# Patient Record
Sex: Female | Born: 1980 | Race: White | Hispanic: No | Marital: Married | Smoking: Never smoker
Health system: Southern US, Community
[De-identification: ages and names within clinical notes are randomized; demographics above are authoritative.]

## PROBLEM LIST (undated history)

## (undated) DIAGNOSIS — N3090 Cystitis, unspecified without hematuria: Secondary | ICD-10-CM

## (undated) DIAGNOSIS — K8020 Calculus of gallbladder without cholecystitis without obstruction: Secondary | ICD-10-CM

## (undated) DIAGNOSIS — I2699 Other pulmonary embolism without acute cor pulmonale: Secondary | ICD-10-CM

## (undated) DIAGNOSIS — N200 Calculus of kidney: Secondary | ICD-10-CM

## (undated) DIAGNOSIS — G43909 Migraine, unspecified, not intractable, without status migrainosus: Secondary | ICD-10-CM

## (undated) HISTORY — PX: TONSILLECTOMY: SUR1361

## (undated) HISTORY — PX: AUGMENTATION MAMMAPLASTY: SUR837

---

## 2004-01-25 ENCOUNTER — Emergency Department: Payer: Self-pay | Admitting: Unknown Physician Specialty

## 2004-01-30 ENCOUNTER — Emergency Department: Payer: Self-pay | Admitting: General Practice

## 2005-02-17 ENCOUNTER — Emergency Department: Payer: Self-pay | Admitting: Emergency Medicine

## 2005-09-29 ENCOUNTER — Emergency Department: Payer: Self-pay | Admitting: General Practice

## 2006-07-27 ENCOUNTER — Observation Stay: Payer: Self-pay | Admitting: Obstetrics & Gynecology

## 2006-11-07 ENCOUNTER — Observation Stay: Payer: Self-pay | Admitting: Obstetrics and Gynecology

## 2006-11-15 ENCOUNTER — Inpatient Hospital Stay: Payer: Self-pay

## 2007-04-24 ENCOUNTER — Emergency Department: Payer: Self-pay | Admitting: Emergency Medicine

## 2007-06-21 ENCOUNTER — Ambulatory Visit: Payer: Self-pay | Admitting: Urology

## 2007-06-28 ENCOUNTER — Ambulatory Visit: Payer: Self-pay | Admitting: Urology

## 2007-07-07 ENCOUNTER — Ambulatory Visit: Payer: Self-pay | Admitting: Family Medicine

## 2007-07-12 ENCOUNTER — Ambulatory Visit: Payer: Self-pay | Admitting: Urology

## 2007-07-16 ENCOUNTER — Ambulatory Visit: Payer: Self-pay

## 2007-07-24 ENCOUNTER — Emergency Department: Payer: Self-pay | Admitting: Emergency Medicine

## 2007-09-15 ENCOUNTER — Ambulatory Visit: Payer: Self-pay | Admitting: Unknown Physician Specialty

## 2007-09-16 ENCOUNTER — Ambulatory Visit: Payer: Self-pay | Admitting: Unknown Physician Specialty

## 2007-10-05 ENCOUNTER — Encounter
Admission: RE | Admit: 2007-10-05 | Discharge: 2007-10-05 | Payer: Self-pay | Admitting: Physical Medicine & Rehabilitation

## 2008-06-09 ENCOUNTER — Observation Stay: Payer: Self-pay | Admitting: Unknown Physician Specialty

## 2008-06-15 ENCOUNTER — Inpatient Hospital Stay: Payer: Self-pay

## 2009-08-10 ENCOUNTER — Emergency Department: Payer: Self-pay | Admitting: Emergency Medicine

## 2009-10-09 ENCOUNTER — Emergency Department: Payer: Self-pay | Admitting: Emergency Medicine

## 2009-12-21 ENCOUNTER — Emergency Department: Payer: Self-pay | Admitting: Emergency Medicine

## 2010-04-08 ENCOUNTER — Ambulatory Visit: Payer: Self-pay | Admitting: Internal Medicine

## 2010-09-09 ENCOUNTER — Ambulatory Visit: Payer: Self-pay

## 2010-10-28 ENCOUNTER — Ambulatory Visit: Payer: Self-pay

## 2010-12-10 ENCOUNTER — Ambulatory Visit: Payer: Self-pay | Admitting: Internal Medicine

## 2011-01-02 ENCOUNTER — Ambulatory Visit: Payer: Self-pay

## 2011-01-02 ENCOUNTER — Inpatient Hospital Stay: Payer: Self-pay | Admitting: Internal Medicine

## 2011-01-13 ENCOUNTER — Ambulatory Visit: Payer: Self-pay | Admitting: Internal Medicine

## 2011-01-21 ENCOUNTER — Ambulatory Visit: Payer: Self-pay | Admitting: Internal Medicine

## 2011-01-25 LAB — PROTIME-INR: Prothrombin Time: 33.9 secs — ABNORMAL HIGH (ref 11.5–14.7)

## 2011-01-27 LAB — PROTIME-INR
INR: 1.8
Prothrombin Time: 45.3 secs — ABNORMAL HIGH (ref 11.5–14.7)
Prothrombin Time: 21.3 secs — ABNORMAL HIGH (ref 11.5–14.7)

## 2011-02-03 LAB — PROTIME-INR
INR: 1.7
Prothrombin Time: 20 secs — ABNORMAL HIGH (ref 11.5–14.7)

## 2011-02-06 LAB — PROTIME-INR: Prothrombin Time: 20.7 secs — ABNORMAL HIGH (ref 11.5–14.7)

## 2011-02-10 LAB — HCG, QUANTITATIVE, PREGNANCY: Beta Hcg, Quant.: 944 m[IU]/mL — ABNORMAL HIGH

## 2011-02-10 LAB — PROTIME-INR: INR: 1.9

## 2011-02-12 ENCOUNTER — Ambulatory Visit: Payer: Self-pay | Admitting: Internal Medicine

## 2011-02-17 ENCOUNTER — Encounter: Payer: Self-pay | Admitting: Obstetrics and Gynecology

## 2011-02-17 LAB — CBC WITH DIFFERENTIAL/PLATELET
Basophil #: 0 10*3/uL (ref 0.0–0.1)
Eosinophil %: 1.6 %
HCT: 38.6 % (ref 35.0–47.0)
HGB: 13.2 g/dL (ref 12.0–16.0)
Lymphocyte #: 1.9 10*3/uL (ref 1.0–3.6)
MCHC: 34 g/dL (ref 32.0–36.0)
MCV: 95 fL (ref 80–100)
Monocyte %: 5.1 %
Platelet: 211 10*3/uL (ref 150–440)
RBC: 4.07 10*6/uL (ref 3.80–5.20)
WBC: 9.6 10*3/uL (ref 3.6–11.0)

## 2011-02-20 LAB — COMPREHENSIVE METABOLIC PANEL
Albumin: 3.8 g/dL (ref 3.4–5.0)
Anion Gap: 12 (ref 7–16)
BUN: 11 mg/dL (ref 7–18)
Bilirubin,Total: 0.5 mg/dL (ref 0.2–1.0)
Chloride: 104 mmol/L (ref 98–107)
Creatinine: 0.76 mg/dL (ref 0.60–1.30)
EGFR (African American): >60
EGFR (Non-African Amer.): >60
Glucose: 103 mg/dL — ABNORMAL HIGH (ref 65–99)
Osmolality: 279 (ref 275–301)
Potassium: 3.9 mmol/L (ref 3.5–5.1)
SGPT (ALT): 71 U/L
Sodium: 140 mmol/L (ref 136–145)
Total Protein: 7.2 g/dL (ref 6.4–8.2)

## 2011-02-20 LAB — HCG, QUANTITATIVE, PREGNANCY: Beta Hcg, Quant.: 20433 m[IU]/mL — ABNORMAL HIGH

## 2011-02-20 LAB — CBC
HCT: 36 % (ref 35.0–47.0)
MCH: 32.1 pg (ref 26.0–34.0)
MCHC: 33.5 g/dL (ref 32.0–36.0)
RDW: 13.8 % (ref 11.5–14.5)

## 2011-02-20 LAB — PROTIME-INR
INR: 1
Prothrombin Time: 13.3 secs (ref 11.5–14.7)

## 2011-02-20 LAB — CK TOTAL AND CKMB (NOT AT ARMC): CK-MB: <0.5 ng/mL — ABNORMAL LOW (ref 0.5–3.6)

## 2011-02-21 ENCOUNTER — Ambulatory Visit: Payer: Self-pay | Admitting: Internal Medicine

## 2011-02-21 ENCOUNTER — Inpatient Hospital Stay: Payer: Self-pay | Admitting: Internal Medicine

## 2011-02-22 LAB — CBC WITH DIFFERENTIAL/PLATELET
Basophil #: 0.1 10*3/uL (ref 0.0–0.1)
Eosinophil #: 0.1 10*3/uL (ref 0.0–0.7)
Eosinophil %: 1.6 %
Lymphocyte #: 2.4 10*3/uL (ref 1.0–3.6)
Lymphocyte %: 31.6 %
MCHC: 33.7 g/dL (ref 32.0–36.0)
MCV: 95 fL (ref 80–100)
Monocyte %: 6.5 %
Platelet: 171 10*3/uL (ref 150–440)
RDW: 13.9 % (ref 11.5–14.5)
WBC: 7.4 10*3/uL (ref 3.6–11.0)

## 2011-02-22 LAB — APTT: Activated PTT: 68.3 secs — ABNORMAL HIGH (ref 23.6–35.9)

## 2011-02-22 LAB — BASIC METABOLIC PANEL
Anion Gap: 11 (ref 7–16)
Chloride: 105 mmol/L (ref 98–107)
Co2: 24 mmol/L (ref 21–32)
Creatinine: 0.61 mg/dL (ref 0.60–1.30)
EGFR (Non-African Amer.): >60
Osmolality: 279 (ref 275–301)
Potassium: 3.9 mmol/L (ref 3.5–5.1)

## 2011-02-23 LAB — APTT: Activated PTT: 82.2 secs — ABNORMAL HIGH (ref 23.6–35.9)

## 2011-02-24 ENCOUNTER — Encounter: Payer: Self-pay | Admitting: Maternal and Fetal Medicine

## 2011-03-13 ENCOUNTER — Ambulatory Visit: Payer: Self-pay | Admitting: Internal Medicine

## 2011-03-21 ENCOUNTER — Ambulatory Visit: Payer: Self-pay | Admitting: Internal Medicine

## 2014-01-21 ENCOUNTER — Encounter (HOSPITAL_COMMUNITY): Payer: Self-pay | Admitting: Emergency Medicine

## 2014-01-21 DIAGNOSIS — Z8742 Personal history of other diseases of the female genital tract: Secondary | ICD-10-CM | POA: Insufficient documentation

## 2014-01-21 DIAGNOSIS — Z8669 Personal history of other diseases of the nervous system and sense organs: Secondary | ICD-10-CM | POA: Insufficient documentation

## 2014-01-21 DIAGNOSIS — M549 Dorsalgia, unspecified: Secondary | ICD-10-CM | POA: Insufficient documentation

## 2014-01-21 DIAGNOSIS — Z8719 Personal history of other diseases of the digestive system: Secondary | ICD-10-CM | POA: Insufficient documentation

## 2014-01-21 DIAGNOSIS — Z3202 Encounter for pregnancy test, result negative: Secondary | ICD-10-CM | POA: Insufficient documentation

## 2014-01-21 DIAGNOSIS — R197 Diarrhea, unspecified: Secondary | ICD-10-CM | POA: Insufficient documentation

## 2014-01-21 DIAGNOSIS — Z87442 Personal history of urinary calculi: Secondary | ICD-10-CM | POA: Insufficient documentation

## 2014-01-21 DIAGNOSIS — R1011 Right upper quadrant pain: Secondary | ICD-10-CM | POA: Insufficient documentation

## 2014-01-21 LAB — CBC WITH DIFFERENTIAL/PLATELET
Basophils Absolute: 0 10*3/uL (ref 0.0–0.1)
Basophils Relative: 0 % (ref 0–1)
Eosinophils Absolute: 0.2 10*3/uL (ref 0.0–0.7)
Eosinophils Relative: 3 % (ref 0–5)
HCT: 39 % (ref 36.0–46.0)
HEMOGLOBIN: 13.5 g/dL (ref 12.0–15.0)
LYMPHS PCT: 37 % (ref 12–46)
Lymphs Abs: 2.5 10*3/uL (ref 0.7–4.0)
MCH: 32.7 pg (ref 26.0–34.0)
MCHC: 34.6 g/dL (ref 30.0–36.0)
MCV: 94.4 fL (ref 78.0–100.0)
MONOS PCT: 7 % (ref 3–12)
Monocytes Absolute: 0.4 10*3/uL (ref 0.1–1.0)
NEUTROS ABS: 3.6 10*3/uL (ref 1.7–7.7)
Neutrophils Relative %: 53 % (ref 43–77)
Platelets: 250 10*3/uL (ref 150–400)
RBC: 4.13 MIL/uL (ref 3.87–5.11)
RDW: 12.8 % (ref 11.5–15.5)
WBC: 6.8 10*3/uL (ref 4.0–10.5)

## 2014-01-21 LAB — I-STAT TROPONIN, ED: Troponin i, poc: 0 ng/mL (ref 0.00–0.08)

## 2014-01-21 MED ORDER — ONDANSETRON HCL 4 MG/2ML IJ SOLN
4.0000 mg | Freq: Once | INTRAMUSCULAR | Status: AC
  Administered 2014-01-22: 4 mg via INTRAVENOUS
  Filled 2014-01-21: qty 2

## 2014-01-21 NOTE — ED Notes (Addendum)
Pt. reports right abdominal pain with nausea and diarrhea , pain radiating to lower back and mid chest , pt. stated pain similar to her gallstones in the past .

## 2014-01-22 ENCOUNTER — Emergency Department (HOSPITAL_COMMUNITY)
Admission: EM | Admit: 2014-01-22 | Discharge: 2014-01-22 | Disposition: A | Discharge status: Home / Self Care | Payer: Self-pay | Attending: Emergency Medicine | Admitting: Emergency Medicine

## 2014-01-22 ENCOUNTER — Emergency Department (HOSPITAL_COMMUNITY): Payer: Self-pay

## 2014-01-22 ENCOUNTER — Encounter (HOSPITAL_COMMUNITY): Payer: Self-pay | Admitting: Emergency Medicine

## 2014-01-22 DIAGNOSIS — M7918 Myalgia, other site: Secondary | ICD-10-CM

## 2014-01-22 DIAGNOSIS — R52 Pain, unspecified: Secondary | ICD-10-CM

## 2014-01-22 HISTORY — DX: Calculus of kidney: N20.0

## 2014-01-22 HISTORY — DX: Cystitis, unspecified without hematuria: N30.90

## 2014-01-22 HISTORY — DX: Calculus of gallbladder without cholecystitis without obstruction: K80.20

## 2014-01-22 HISTORY — DX: Migraine, unspecified, not intractable, without status migrainosus: G43.909

## 2014-01-22 LAB — POC URINE PREG, ED: Preg Test, Ur: NEGATIVE

## 2014-01-22 LAB — URINALYSIS, ROUTINE W REFLEX MICROSCOPIC
BILIRUBIN URINE: NEGATIVE
Glucose, UA: NEGATIVE mg/dL
Ketones, ur: NEGATIVE mg/dL
LEUKOCYTES UA: NEGATIVE
Nitrite: NEGATIVE
Protein, ur: NEGATIVE mg/dL
SPECIFIC GRAVITY, URINE: 1.029 (ref 1.005–1.030)
Urobilinogen, UA: 0.2 mg/dL (ref 0.0–1.0)
pH: 6 (ref 5.0–8.0)

## 2014-01-22 LAB — COMPREHENSIVE METABOLIC PANEL
ALT: 28 U/L (ref 0–35)
AST: 26 U/L (ref 0–37)
Albumin: 4 g/dL (ref 3.5–5.2)
Alkaline Phosphatase: 70 U/L (ref 39–117)
Anion gap: 8 (ref 5–15)
BILIRUBIN TOTAL: 0.8 mg/dL (ref 0.3–1.2)
BUN: 14 mg/dL (ref 6–23)
CALCIUM: 9.1 mg/dL (ref 8.4–10.5)
CO2: 25 mmol/L (ref 19–32)
Chloride: 105 mEq/L (ref 96–112)
Creatinine, Ser: 0.64 mg/dL (ref 0.50–1.10)
GFR calc Af Amer: >90 mL/min (ref >90)
GFR calc non Af Amer: >90 mL/min (ref >90)
GLUCOSE: 122 mg/dL — AB (ref 70–99)
POTASSIUM: 3.6 mmol/L (ref 3.5–5.1)
Sodium: 138 mmol/L (ref 135–145)
Total Protein: 6.5 g/dL (ref 6.0–8.3)

## 2014-01-22 LAB — URINE MICROSCOPIC-ADD ON

## 2014-01-22 LAB — PREGNANCY, URINE: PREG TEST UR: NEGATIVE

## 2014-01-22 LAB — LIPASE, BLOOD: Lipase: 28 U/L (ref 11–59)

## 2014-01-22 MED ORDER — NAPROXEN 375 MG PO TABS: 375.0000 mg | ORAL_TABLET | Freq: Two times a day (BID) | ORAL | Status: DC

## 2014-01-22 MED ORDER — METHOCARBAMOL 500 MG PO TABS
1000.0000 mg | ORAL_TABLET | Freq: Once | ORAL | Status: AC
  Administered 2014-01-22: 1000 mg via ORAL
  Filled 2014-01-22: qty 2

## 2014-01-22 MED ORDER — IOHEXOL 350 MG/ML SOLN
75.0000 mL | Freq: Once | INTRAVENOUS | Status: AC | PRN
  Administered 2014-01-22: 75 mL via INTRAVENOUS

## 2014-01-22 MED ORDER — METHOCARBAMOL 500 MG PO TABS: 500.0000 mg | ORAL_TABLET | Freq: Two times a day (BID) | ORAL | Status: DC

## 2014-01-22 MED ORDER — KETOROLAC TROMETHAMINE 30 MG/ML IJ SOLN
30.0000 mg | Freq: Once | INTRAMUSCULAR | Status: AC
  Administered 2014-01-22: 30 mg via INTRAVENOUS
  Filled 2014-01-22: qty 1

## 2014-01-22 MED ORDER — MORPHINE SULFATE 4 MG/ML IJ SOLN
4.0000 mg | Freq: Once | INTRAMUSCULAR | Status: AC
  Administered 2014-01-22: 4 mg via INTRAVENOUS
  Filled 2014-01-22: qty 1

## 2014-01-22 MED ORDER — OXYCODONE-ACETAMINOPHEN 5-325 MG PO TABS
1.0000 | ORAL_TABLET | Freq: Once | ORAL | Status: AC
  Administered 2014-01-22: 1 via ORAL
  Filled 2014-01-22: qty 1

## 2014-01-22 MED ORDER — GI COCKTAIL ~~LOC~~
30.0000 mL | Freq: Once | ORAL | Status: AC
  Administered 2014-01-22: 30 mL via ORAL
  Filled 2014-01-22: qty 30

## 2014-01-22 MED ORDER — HYDROCODONE-ACETAMINOPHEN 5-325 MG PO TABS: 1.0000 | ORAL_TABLET | Freq: Four times a day (QID) | ORAL | Status: DC | PRN

## 2014-01-22 NOTE — ED Notes (Signed)
MD at bedside. 

## 2014-01-22 NOTE — ED Provider Notes (Signed)
CSN: [id number]     Arrival date & time 01/21/14  2257 History  This chart was scribed for Casey Smitty Cords, MD by Abel Presto, ED Scribe. This patient was seen in room D35C/D35C and the patient's care was started at 12:15 AM.    Chief Complaint  Patient presents with  . Abdominal Pain     Patient is a 34 y.o. female presenting with abdominal pain. The history is provided by the patient. No language interpreter was used.  Abdominal Pain Pain location:  RUQ Pain quality: bloating   Pain radiates to:  R shoulder and chest Pain severity:  Severe Duration:  1 day Progression:  Unchanged Context: awakening from sleep   Relieved by:  Nothing Worsened by:  Nothing tried Ineffective treatments:  None tried Associated symptoms: chest pain and diarrhea   Associated symptoms: no constipation, no dysuria, no fever, no hematuria, no shortness of breath and no vomiting   Risk factors: no alcohol abuse     HPI Comments: Casey Barry is a 34 y.o. female with PMHx of gallstones who presents to the Emergency Department complaining of abdominal pain with onset yesterday. Pt states pain woke her from her sleep this morning.   Pt notes associated cramping and bloating. Pt notes pain radiates to her right shoulder, and mid chest. Pt notes she fell onto a table and hit her right upper chest  2 days ago.  Pt notes she ate a grilled hot dog for lunch after onset. She notes pain is similar to her gallstone pain. She has tried rubbing the area of pain for relief. Pt states she had loose stool once today which is different than baseline. Pt notes LNMP was 12/27. She states she is on bcp to be removed in February. Pt notes she does not take anymore medication.  Pt denies dysuria, frequency, hematuria, constipation, and vomiting.   Past Medical History  Diagnosis Date  . Cystitis   . Gall stones   . Migraines   . Kidney stones    Past Surgical History  Procedure Laterality Date  . Tonsillectomy      No family history on file. History  Substance Use Topics  . Smoking status: Never Smoker   . Smokeless tobacco: Not on file  . Alcohol Use: No   OB History    No data available     Review of Systems  Constitutional: Negative for fever.  Respiratory: Negative for shortness of breath.   Cardiovascular: Positive for chest pain. Negative for palpitations and leg swelling.  Gastrointestinal: Positive for abdominal pain and diarrhea. Negative for vomiting and constipation.  Genitourinary: Negative for dysuria, frequency and hematuria.  Musculoskeletal: Positive for myalgias and back pain.  All other systems reviewed and are negative.     Allergies  Levaquin  Home Medications   Prior to Admission medications   Not on File   BP 100/70 mmHg  Pulse 70  Temp(Src) 97.6 F (36.4 C) (Oral)  Resp 14  Ht 5' (1.524 m)  Wt 150 lb (68.04 kg)  BMI 29.30 kg/m2  SpO2 100%  LMP 01/15/2014 Physical Exam  Constitutional: She is oriented to person, place, and time. She appears well-developed and well-nourished.  HENT:  Head: Normocephalic.  Mouth/Throat: Oropharynx is clear and moist.  Moist mucus membranes  Eyes: Conjunctivae are normal. Pupils are equal, round, and reactive to light.  Neck: Normal range of motion. Neck supple. No tracheal deviation present. No thyromegaly present.  Cardiovascular: Normal rate, regular rhythm  and normal heart sounds.  Exam reveals no friction rub.   No murmur heard. Pulmonary/Chest: Effort normal and breath sounds normal. No respiratory distress. She has no wheezes. She has no rales.  Abdominal: Soft. She exhibits no distension. There is no tenderness. There is no rebound, no guarding, no tenderness at McBurney's point and negative Murphy's sign.  Hyperactive bowel sounds.  No cva tenderness  Musculoskeletal: Normal range of motion. She exhibits no edema or tenderness.  no winging of scalpula, no bruising, clavicles intact   Lymphadenopathy:     She has no cervical adenopathy.  Neurological: She is alert and oriented to person, place, and time. She has normal reflexes.  No bruising  Skin: Skin is warm and dry.  Psychiatric: She has a normal mood and affect. Her behavior is normal.  Nursing note and vitals reviewed.   ED Course  Procedures (including critical care time) DIAGNOSTIC STUDIES: Oxygen Saturation is 100% on room air, normal by my interpretation.    COORDINATION OF CARE: 12:22 AM Discussed treatment plan with patient at beside, the patient agrees with the plan and has no further questions at this time.   Labs Review Labs Reviewed  COMPREHENSIVE METABOLIC PANEL - Abnormal; Notable for the following:    Glucose, Bld 122 (*)    All other components within normal limits  CBC WITH DIFFERENTIAL  LIPASE, BLOOD  PREGNANCY, URINE  URINALYSIS, ROUTINE W REFLEX MICROSCOPIC  I-STAT TROPOININ, ED    Imaging Review No results found.   EKG Interpretation None      Date: 01/22/2014  Rate: 82  Rhythm: normal sinus rhythm  QRS Axis: normal  Intervals: normal  ST/T Wave abnormalities: normal  Conduction Disutrbances: none  Narrative Interpretation: unremarkable     MDM   Final diagnoses:  None   In care everywhere patient has a h/o of PE.  With chest pain and h/o of PE and OCP was ruled out for PE.  Suspect pain in the abdomen and chest is from fall.  No signs of cholecystitis no murphy's sign no elevation of LFTs and WBC.  Follow up as directed with surgery.  Will treat for pain from fall.     I personally performed the services described in this documentation, which was scribed in my presence. The recorded information has been reviewed and is accurate.     Jasmine Awe, MD 01/22/14 [phone]

## 2014-01-22 NOTE — ED Notes (Signed)
Patient up to restroom.

## 2014-01-22 NOTE — ED Notes (Signed)
Patient with history of gallstones, has not been able to have it removed yet due to insurance proposes. Patient reports symptoms started this afternoon and no home remedies including rubbing RUQ helping. No n/v. Reports feeling "gassy and bloated"

## 2014-01-22 NOTE — ED Notes (Signed)
Patient transported to Ultrasound 

## 2014-05-14 NOTE — H&P (Signed)
PATIENT NAME:  Casey Barry, Casey Barry MR#:  [id number] DATE OF BIRTH:  30-Jul-1980  DATE OF ADMISSION:  02/21/2011  REFERRING PHYSICIAN: Dr. Sharma Covert    PRIMARY CARE PHYSICIAN: None.   PRIMARY HEMATOLOGIST/ONCOLOGIST: Dr. Sherrlyn Hock    PRESENTING COMPLAINT: Chest pain, shortness of breath, back pain.   HISTORY OF PRESENT ILLNESS: Casey Barry is a 34 year old woman with history of chronic interstitial cystitis, nephrolithiasis, recent diagnosis of bilateral PE and probable DVT on 01/02/2011 who represents with reports of developing chest pain, left side greater than right, as well as back pain with worsening back pain on deep inspiration and shortness of breath that began four days ago. She was diagnosed with bilateral PE in December 2012 and was discharged on Coumadin thought to be related to NuvaRing. She had been off her oral contraceptive. The patient ended up pregnant and had ultimately conversion of the warfarin to Lovenox and has been on Lovenox since 02/10/2011. She did have an anti-Xa level checked on 02/17/2011 and that was subtherapeutic at 0.20. The patient endorses scant nausea and vomiting and reports of streaky blood in vomit. No blood in her stool or urine. She has been having some diarrhea and abdominal pain.   PAST MEDICAL HISTORY:  1. Admitted 12/13 to 01/09/2011 for management of bilateral PE and likely DVT thought to be related to NuvaRing. Hypercoagulable work-up has been negative. Her hospital course was complicated by elevated troponin and pulmonary hypertension thought to be related to the PE as well as tachycardia.  2. Incidental finding of a right lower lung nodule on CT chest in December.  3. Chronic interstitial cystitis.  4. Nephrolithiasis.  5. Anxiety.  6. The patient is G8, P6.  PAST SURGICAL HISTORY:  1. Cystoscopy with hydrodilatation.  2. Hysteroscopy with removal of IUD.   ALLERGIES: Levaquin causes hives and swelling.   MEDICATIONS:  1. Lovenox 80 mg sub-Q every 12  hours.  2. Percocet 5/325 1 tablet every 12 hours as needed.  3. Klonopin 0.25 mg b.i.d. as needed.  4. Macrobid 100 mg b.i.d.   SOCIAL HISTORY: No tobacco, alcohol, or drug use. She has six children and currently unemployed.   FAMILY HISTORY: Father with hypertension. Maternal grandmother with stroke. Paternal grandfather with stroke. Maternal grandfather with Parkinson's.   REVIEW OF SYSTEMS: CONSTITUTIONAL: She felt subjective fevers. No chills. She endorses some mild nausea and vomiting. EYES: No changes in her vision. ENT: No ear pain, hearing loss. Denies any epistaxis or discharge. RESPIRATORY: No cough, wheezing. Endorses shortness of breath. No hemoptysis. CARDIOVASCULAR: Chest pain as per history of present illness. No orthopnea or edema. Endorses palpitations with the chest pain. Endorses presyncope but no syncope. GI: Nausea and vomiting as per history of present illness with streaking blood in her spit. Endorses abdominal pain and diarrhea. No hematemesis or melena. GU: Endorses dysuria, which appears to be chronic. No hematuria. ENDOCRINE: No polyuria or polydipsia. HEME: No easy bleeding. SKIN: No ulcers. MUSCULOSKELETAL: Reports neck pain and back pain as per history of present illness. NEUROLOGIC: No one-sided weakness or numbness. PSYCH: She has depression and anxiety due to the increased stressors but no suicidal ideation.   PHYSICAL EXAMINATION:   VITAL SIGNS: Temperature 97.9, pulse 89, respiratory rate 20, blood pressure 137/78, sating at 98% on room air.   GENERAL: Lying in bed in no apparent distress.   HEENT: Normocephalic, atraumatic. Pupils are equal, symmetric, nonicteric. Nares without discharge. Moist mucous membrane.   NECK: Soft and supple. No adenopathy or JVP.  CARDIOVASCULAR: Non-tachy. No murmurs, rubs, or gallops.   LUNGS: Clear to auscultation bilaterally. No use of accessory muscles or increased respiratory effort.   ABDOMEN: Soft. Mild tenderness on  palpation. Positive bowel sounds. No mass appreciated.   EXTREMITIES: No edema. Dorsal pedis pulses intact.   MUSCULOSKELETAL: No spinal tenderness or CVA tenderness. She has full range of motion of her neck.   NEUROLOGIC: Symmetrical strength. No focal deficits.   PSYCH: She is alert and oriented. The patient is cooperative.   PERTINENT LABS AND STUDIES: Ultrasound, transvaginal, shows a single viable intrauterine pregnancy, estimated gestational age of [redacted] weeks, 1 day. Beta-HCG 20,433. D-dimer 0.33. WBC 10.9, hemoglobin 12, hematocrit 36, MCV 96, glucose 103, BUN 11, creatinine 0.76, sodium 140, potassium 3.9, chloride 104, carbon dioxide 24, calcium 8.8, total bilirubin 0.5, alkaline phosphatase 109, ALT 71, AST 42. Troponin less than 0.02. CK 36. MB less than 0.5. INR 1. PTT 36.1. EKG with sinus rate of 82. No ST changes. There is T wave inversion in V1, V2, and aVR. Lower extremity Doppler bilaterally shows no DVT.   ASSESSMENT AND PLAN: Casey Barry is a 34 year old woman with history of DVT, PE diagnosed in December 2012 thought to be in the setting of oral contraceptives with intrauterine pregnancy at six weeks and pulmonary hypertension presenting with chest pain, back pain, shortness of breath.  1. Questionable recurrent PE on Lovenox. Questionable failed outpatient anti-coagulation therapy. The patient was switched from Coumadin to Lovenox due to the intrauterine pregnancy. Bilateral lower extremity Doppler's are negative. Doppler's on 01/06/2011 were also negative. An anti-Xa level obtained on 01/28 was subtherapeutic at 0.2. Discussed the case with Dr. Lorre NickGittin. The patient will be admitted for further evaluation and recommendation on anticoagulation. For now will initiate on heparin drip. Presumed again failure on outpatient anticoagulation treatment. Dr. Sherrlyn HockPandit will see in the morning for further recommendations. Will obtain also Vascular Surgery consultation to evaluate for requirement of IVC  filter.  2. Anxiety. Restart Klonopin as needed.  3. Intrauterine pregnancy at six weeks. OB to follow if needed based on the perinatal consult. The patient is still unsure of keeping pregnancy.  4. Right lower lobe peripheral lesion. Plan for follow-up CT possibly postdelivery. Questionable if this is a contributing factor to her hypercoagulable state.  5. Prophylaxis on heparin drip and Protonix.  6. History of chronic interstitial cystitis. Restart her Macrobid.   TIME SPENT: Approximately 50 minutes spent on patient care.   ____________________________ Reuel DerbyAlounthith Jazma Pickel, MD ap:drc D: 02/21/2011 04:21:43 ET T: 02/21/2011 09:23:48 ET JOB#: [id number]  cc: Pearlean BrownieAlounthith Giannis Corpuz, MD, <Dictator> Reuel DerbyALOUNTHITH Rhia Blatchford MD ELECTRONICALLY SIGNED 03/18/2011 0:32

## 2014-05-14 NOTE — Discharge Summary (Signed)
PATIENT NAME:  Casey Barry, Casey Barry MR#:  [id number] DATE OF BIRTH:  1980/09/23  DATE OF ADMISSION:  01/02/2011 DATE OF DISCHARGE:  01/09/2011  PRIMARY CARE PHYSICIAN:  [facility]  REASON FOR ADMISSION: Chest pain and shortness of breath.   DISCHARGE DIAGNOSES:  1. Bilateral pulmonary emboli secondary to NuvaRing.  2. Elevated troponin due to pulmonary emboli. 3. Tachycardia due to pulmonary emboli.  4. Abnormal chest CT with peripheral lesion in the right lower lobe. The patient will need  follow-up CT in 3 to 6 months, which will be deferred to the patient's primary care physician.  5. Chronic interstitial cystitis.  6. Pulmonary hypertension likely secondary to pulmonary emboli. 7. Anxiety. 8. Hyperglycemia felt to be stress-induced.  DISCHARGE MEDICATIONS:  1. Coumadin 7.5 mg daily.  2. Percocet 5/325, 1 tab p.o. q.12 hours p.r.n. pain. 3. Klonopin 0.25 mg p.o. b.i.d. p.r.n. anxiety. 4. Macrobid 100 mg p.o. b.i.d.   DISCHARGE DISPOSITION: Home.   DISCHARGE CONDITION: Improved, stable.  DISCHARGE ACTIVITY:  As tolerated.  DISCHARGE DIET:  Regular.  DISCHARGE INSTRUCTIONS: 1. Take medications as prescribed. 2. Return to the Emergency Department for recurrence of symptoms or for worsening chest pain or shortness of breath or for development of any bleeding complications.   FOLLOWUP INSTRUCTIONS:  1. Follow up with [facility] within 1 to 2 weeks. The patient needs repeat PT/INR in the next 2 to 3 days and she will also need repeat chest CT in 3 to 6 months to assess abnormality in the right lower lobe.  2. Follow up with gynecologist in 1 to 2 weeks.   REFERRALS:  1. The patient will be referred to the first available hematologist at the [facility] for hypocoagulable work-up due to pulmonary embolus.  2. The patient is also being referred to Dr. Evelene Croon of urology within 1 to 2 weeks for chronic interstitial cystitis.  CONSULTATIONS: None.    PROCEDURES: 1. Portable chest x-ray on admission: No acute cardiopulmonary abnormalities are noted. 2. Chest CT with PE protocol 01/02/2011: Prominent bilateral pulmonary emboli, right lower lobe peripheral lesion, could be atelectasis versus infarct versus pneumonia versus mass lesion noted. The patient will need follow-up chest CT to demonstrate resolution.  3. Bilateral lower extremity venous Doppler ultrasound 01/06/2011: Negative for deep vein thrombosis bilaterally. 4. 2-D echocardiogram 01/03/2011: LV grossly normal in size. No thrombus. LV systolic function normal. Ejection fraction greater than 55%. Normal LV wall thickness. LV wall motion is normal. RV systolic function is normal. Mild TR. Right ventricular systolic pressure is elevated at 30 to 40 mm mercury.   5. EKG on admission: Sinus tachycardia, heart rate 123 beats per minute with nonspecific ST and T wave changes with some T-wave inversions in anterior leads.  LABORATORY DATA: D-dimer 6 on admission.  INR 1 on admission and 2 on the day of discharge. CBC normal on admission. CBC normal from 01/06/2011.  Cardiac enzymes on admission: Troponin 0.15 with normal CK and MB levels.  Three sets of cardiac enzymes were negative including three troponins,  CK-MBs, and CK levels.   BMP normal on admission except for serum glucose slightly elevated at 132 and hemoglobin A1c 5.7.   Urinalysis 01/08/2011: Clear urine with one RBC, one WBC, negative nitrite, trace leukocyte esterase, no bacteria.   BRIEF HISTORY/ HOSPITAL COURSE: The patient is a 34 year old female with past medical history of chronic interstitial cystitis who presented to the Emergency Department with complaints of chest pain and shortness of breath.  Please see dictated admission History and Physical for pertinent details surrounding the onset of this hospitalization. Please see below for further details.   1. Bilateral pulmonary emboli:  Presented with chest pain and  shortness of breath. D-dimer was elevated and CT of the chest was obtained and revealed bilateral pulmonary emboli. She was promptly started on anticoagulation with Lovenox and shortly thereafter was started on Coumadin. She was also kept on supplemental oxygen via nasal cannula. She was given pain control. With these measures, the patient's overall clinical condition has significantly improved with significant improvement of her chest pain and shortness of breath. Her PE was felt to be secondary to estrogen supplementation with birth control being NuvaRing. The patient had received anticoagulation in the ER and hypercoagulable work-up was not obtained at that time. She will be referred to hematology as an outpatient for hypercoagulable work-up as she states that her grandmother also had DVTs.  Lower extremity venous Doppler ultrasound was negative for deep vein thrombosis bilaterally. She has not had any periods of prolonged immobility. Therefore, at this time her PE is felt to be secondary to NuvaRing. She will likely need Coumadin for at least six months unless hypercoagulable work-up as an outpatient shows an underlying hypercoagulable state, in which case she will likely need anticoagulation lifelong. She was advised not to take NuvaRing or any estrogen-containing products in the future until further followup with her OB/GYN physician. 2-D echocardiogram was obtained and there was no evidence of heart failure. The patient has remained hemodynamically stable and was not in shock. Therefore she did not undergo any TPA or surgical embolectomy.  With anticoagulation the patient has been successfully weaned off oxygen with improvement of her chest pain and shortness of breath. INR is therapeutic on the day of discharge. Goal INR is 2 to 3. The patient will need to [facility] to have PT/ INR drawn in the 2 to 3 days after hospital discharge with further  adjustments to be made to her Coumadin dosing accordingly.  2. Anxiety: The patient was started on low-dose anxiolytic per her request and did well with low-dose Klonopin. She wishes to resume this medication upon discharge and has been given a prescription for a limited supply.  3. Elevated troponin: Minimal troponin elevation with normal CK and CK-MB levels.  The patient had sinus tachycardia on admission as well as minimally-elevated troponin and this was felt to be secondary to pulmonary embolus as she has no underlying risk factors for coronary artery disease. First troponin was 0.15. The next three troponins and cardiac enzymes were all within normal limits and her chest pain had improved. With anticoagulation the patient's chest pain has significantly improved, and her heart rate had normalized with anticoagulation as well. The cause of the patient's chest pain was felt to be PE rather than coronary artery disease or alternate etiology; therefore, further cardiac diagnostics were not performed at this time and the patient was in agreement.  4. Hyperglycemia:  This was mild and postprandial. There is no prior history of diabetes mellitus. Suspect this is stress-induced. There is no evidence of diabetes mellitus at this time given hemoglobin A1c of 5.7.  5. Abnormal EKG with T-wave inversions in the anterior leads:  Felt to be due to PE rather than underlying coronary artery disease. She will continue anticoagulation.  She has no risk factors for coronary artery disease. Echocardiogram was reviewed with results as above. The patient will  continue anticoagulation for PE which has led to significant improvement of her chest pain. 6. Dysuria:  The patient stated she had some burning on urination on 01/08/2011. Urinalysis was obtained and there is no evidence of a urinary tract infection at this time. She does have chronic interstitial cystitis and she will continue Macrobid. She will be referred to urology as  an outpatient.  On 01/09/2011 the patient was hemodynamically stable with significant improvement of her chest pain and shortness of breath. She had normal oxygen saturation on room air at rest and with ambulation and she was felt to be stable for discharge home with close outpatient followup to which the patient was agreeable.    TIME SPENT ON DISCHARGE: Greater than 30 minutes.   ____________________________ Elon Alas, MD knl:bjt D: 01/13/2011 22:24:46 ET T: 01/15/2011 13:00:14 ET JOB#: [id number]  cc: [facility] Suszanne Conners. Evelene Croon, MD Legrand Pitts, MD Elon Alas MD ELECTRONICALLY SIGNED 01/23/2011 16:24

## 2014-05-14 NOTE — Consult Note (Signed)
PATIENT NAME:  Casey Barry, Casey Barry MR#:  [id number] DATE OF BIRTH:  1980-09-21  DATE OF CONSULTATION:  02/21/2011  REFERRING PHYSICIAN:  Alounthith Phichith, MD CONSULTING PHYSICIAN:  Lucina Betty R. Sherrlyn Hock, MD  REASON FOR CONSULTATION: History of pulmonary embolus, presumed failed outpatient Lovenox therapy.   HISTORY OF PRESENT ILLNESS: The patient is a 34 year old female patient with history of nephrolithiasis, chronic interstitial cystitis, recent diagnosis of bilateral pulmonary embolism on 01/02/2011 (risk factor felt to be NuvaRing usage which she has discontinued), small right lower lung nodule under observation, and recent diagnosis of pregnancy and Coumadin was immediately discontinued and she was switched to Lovenox therapy. The patient had anti-10A activity level checked on 02/12/2011 after the fourth dose of Lovenox and it was in the therapeutic range at 0.98. The patient is now admitted to the hospital on 02/21/2011 with chest pain, back pain, and mild shortness of breath. No hypoxia has been documented. D-dimer this admission is normal at 0.33. Currently states that she still has some anterior chest pain but back pain is better. Recent anti-10A activity repeated on 02/17/2011 by Dr. Quin Hoop was subtherapeutic at 0.20, the patient states that she has been taking Lovenox injections as directed. Lower extremity Doppler on 02/20/2011 was negative for deep vein thrombosis. Ultrasound of the abdomen on 02/20/2011 shows single viable intrauterine pregnancy with estimated gestational age of [redacted] weeks, 1 day. Chest x-ray shows patchy increased density in the right midlung. The patient is currently on unfractionated IV heparin drip with PTT monitoring. She denies any dizziness, headaches, focal weakness, imbalance, falls, or loss of consciousness. She denies any bleeding symptoms.   PAST MEDICAL HISTORY/PAST SURGICAL HISTORY: As in history of present illness. In addition.   1. Anxiety. 2. Nephrolithiasis. 3. Cystoscopy with hydrodilatation. 4. Hysteroscopy with removal of IUD.  GYN HISTORY: Gravida 8, para 6.    FAMILY HISTORY: Remarkable for stroke in grandparents. Maternal grandfather with Parkinson's disease, father had hypertension. Otherwise, denies hematological disorders, thromboembolic disorders, or malignancy.   SOCIAL HISTORY: Denies smoking, alcohol, or recreational drug usage. States that she is physically active and ambulatory.   ALLERGIES: Levaquin.   HOME MEDICATIONS: 1. Lovenox 80 mg subcutaneous every 12 hours.  2. Percocet 5/325 mg every 12 hours p.r.n. for pain.  3. Klonopin 0.25 mg twice a day as needed.  4. Macrobid 100 mg twice a day.  REVIEW OF SYSTEMS: CONSTITUTIONAL: As in history of present illness. She does have some generalized weakness but remains physically active. No fevers, chills, or night sweats. HEENT: No dizziness, headaches, epistaxis, ear or jaw pain. CARDIAC: Denies any palpitations, orthopnea, or paroxysmal nocturnal dyspnea. LUNGS: As in history of present illness. No cough, sputum, or hemoptysis. GASTROINTESTINAL: No nausea, vomiting, or diarrhea. No bright red blood in stools or melena. GENITOURINARY: No dysuria or hematuria. Has chronic intermittent dysuria from cystitis. SKIN: No new rashes or pruritus. HEMATOLOGIC: No bleeding symptoms. NEUROLOGIC: As in history of present illness. MUSCULOSKELETAL: No new bone pain. Has back pain as described above. ENDOCRINE: No polyuria or polydipsia. Appetite is steady.   PHYSICAL EXAMINATION:   GENERAL: The patient is a moderately built and nourished individual, sitting in bed, alert and oriented x4 in no acute distress, converses appropriately. No icterus or pallor.   VITAL SIGNS: 96.7, 87, 20, 102/63, 99% on room air.   HEENT: Normocephalic, atraumatic. Extraocular movements intact. Sclerae anicteric. No oral thrush.   NECK: Supple without lymphadenopathy.    CARDIOVASCULAR: S1 and S2, regular rate and rhythm.  LUNGS: Bilateral good air entry, no crepitations or rhonchi.   ABDOMEN: Soft, nontender. No hepatosplenomegaly clinically.   EXTREMITIES: No major edema or cyanosis.   SKIN: No generalized rashes.   NEUROLOGIC: Cranial nerves are intact, grossly nonfocal.   MUSCULOSKELETAL: No obvious joint deformity or swelling. There is tenderness on palpation over costochondral junction anteriorly.   LAB RESULTS: Creatinine 0.76, potassium 3.9, calcium 8.8. Liver functions unremarkable, except AST of 42 and glucose 103. WBC 10,900, hemoglobin 12, and platelets 186. D-dimer normal at 0.33. Beta-hCG 20,433. PTT 55 seconds.   IMPRESSION AND RECOMMENDATIONS: This is a 34 year old female patient with recently diagnosed bilateral pulmonary embolism on 01/02/2011 who was on anticoagulation, on Coumadin, this was changed to Lovenox once she was diagnosed with recent pregnancy. Interestingly, Lovenox activity level (anti-Xa activity level) checked after fourth dose on 02/12/2011 was on the higher side of therapeutic range at 0.98, and the patient was advised to continue current dose of Lovenox, five days later level was repeated by Dr. Quin HoopEllestad and was very low at 0.20, unclear if the patient has missed any Lovenox doses. She is now admitted with atypical chest pain and back pain, but no hypoxia, respiratory distress, or abnormal d-dimer level. She does have tenderness over the costochondral junctions which could explain the anterior chest pain. Given recent diagnosis of pregnancy, scans are being avoided to look for new areas of pulmonary embolism, the patient states that she is thinking about whether she wants to get any scans done. Unclear if current symptoms represent progression or recurrent pulmonary embolism, also it will be difficult to declare that she has failed Lovenox therapy since anti-Xa activity was not in the therapeutic range on 02/17/2011. At this  time, recommend continuing on unfractionated IV heparin with close monitoring of aPTT and adjustment of heparin dosage to keep in therapeutic range until acute symptoms improve, by then will discuss with [facility] and see if we could switch her over to Lovenox therapy as inpatient, monitor for symptoms, check anti-Xa activity level after the fourth dose to make sure it gets into therapeutic range and then make further decision for outpatient therapy. The patient explained that her case is complicated and definitely needs close monitoring and [facility] and upon discharge she needs to go there for continued follow-up and management. She is agreeable to this plan. Will continue to follow.   Thank you for the referral, please feel free to contact me if any additional questions.  ____________________________ Maren ReamerSandeep R. Sherrlyn HockPandit, MD srp:slb D: 02/22/2011 09:48:00 ET T: 02/22/2011 10:09:42 ET JOB#: [id number]  cc: Norell Brisbin R. Sherrlyn HockPandit, MD, <Dictator> Wille CelesteSANDEEP R Zettie Gootee MD ELECTRONICALLY SIGNED 02/23/2011 9:50

## 2014-05-14 NOTE — Consult Note (Signed)
Referral Information:   Reason for Referral 34yo [id number] with history of bilateral pulmonary emboli diagnosed 01/02/2011 and recent positive pregnancy test (02/10/2011).    Referring Physician Dr. Janese BanksSandeep Pandit    Prenatal Hx Patient is s/p her 6th NVD in August 2012.  She was transitioned from OCPs to Nuva-Ring and [facility] with complaints of chest pain and shortness of breath mid December 2012.  CT of the chest confirmed presence of bilateral pulmonary emboli.  Patient was initiated on Lovenox and then transitioned to Coumadin.  The Nuva-Ring was thought to be an inciting factor in the clot and removed during her hospitalization.  The patient had vaginal bleeding for 2 days.  Previous to that her LMP was 12/20/2010.  She subsequently had no further vaginal bleeding and had a positive home pregnancy test followed by a confirmatory beta hCG on 02/10/2011. (She reports using spermacide for birth control and occasional condom use since the Nuva-Ring was removed.)   At that time her coumadin was stopped and she was started on Lovenox and scheduled for this appointment today.    Past Obstetrical Hx 1.  1997 NVD live female, 8#3oz, [facility] 2.  1998 first trimester SAB (no D&C) 3.  2001 NVD live female, 9#4oz, [facility] 4.  2004 NVD live female, 8#15.5 oz, [facility] 5.  2008 NVD live female, 9#4oz, [facility] 16.  2010 NVD live female, 9#15.5oz, [facility], pregnancy c/by early twin demise at about 5716 weeks (dichorionic diamniotic); delivery was difficult per patient although she is not familiar with the term "shoulder dystocia" 7.  2012 NVD live female, 9#15oz, [facility] 8.  Current   Home Medications:   Lovenox 80 mg/0.8 mL solution: Take 80 mg SQ injection every 12 hours starting at 7AM on 02/11/11, Active  Percocet 5/325 oral tablet: 1 tab(s) orally every 12 hours, As Needed- for Pain , Active  Klonopin: 0.25 milligram(s) orally 2 times a  day, As Needed- for Anxiety, Nervousness , Active  Allergies:   Levaquin: Swelling  Vital Signs/Notes:  Nursing Vital Signs:  **Vital Signs.:   28-Jan-13 08:09   Vital Signs Type Outpatient perinatal clinic   Temperature Temperature (F) 97.5   Celsius 36.3   Temperature Source oral   Pulse Pulse 81   Pulse source per Dinamap   Respirations Respirations 16   Systolic BP Systolic BP 102   Diastolic BP (mmHg) Diastolic BP (mmHg) 71   Mean BP 81   Perinatal Consult:   LMP 20-Dec-2010    PGyn Hx Denies abnormal PAPs or STDs     Past Medical History cont'd 1.  Migraine headaches - has seen neurology and has tried multiple migrain medications including Imitrex.  Currently states migraines occur up to several times a week.  Currently management with percocet (usually 2 tabs once a day). 2.  Kidney stones - last occurrence was about 2 years ago 3.  Interstitial cystitis - diagnosed about 4-5 years ago.  Has had cystoscopy and hydrodistension of the bladder    PSurg Hx 1.  Hysteroscopy for IUD removal  2.  Cystoscopy with hydrodistension  3.  Tonsillectomy    FHx Parents with HTN, MGGM with lymphoma, MGF with Parkinson's, MGM with stroke and clots, PGF with stroke    Occupation Mother Unemployed    Occupation Father Unemployed    Soc Hx Lives with her partner and children; denies tobacco,  ETOH or drug use   Review Of Systems:   Fever/Chills No     Cough No     Abdominal Pain No     Diarrhea No     Constipation No     Nausea/Vomiting No     SOB/DOE No     Chest Pain No     Dysuria No     Tolerating Diet Yes     Medications/Allergies Reviewed Medications/Allergies reviewed        13-Dec-12 20:25, CT Chest for Pulm Embolism With Contrast    15-Dec-12 11:15, [place] Color Flow Doppler Low Extrem Bilat    28-Jan-13 08:00, MFM OB US, Limited Study     Additional Lab/Radiology Notes Thrombophilia w/up initiated and negative thus far includes: Factor V  Leiden Prothrombin Gene mutation Antithrombin III Beta-2 microglobulin Lupus Anticoagulant Anticardiolipin Antibody Homocysteine TSH  Echo in the hospital showed normal EF (>55%) and 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 1.  Pulmonary emboli - negative LE dopplers.  Nuva-Ring removed.  Thrombophilia panel negative (but missing Functional Protein S and C).  Early coumadin exposure (during the all or none period).  Needs a minimum of 6 months therapeutic anticoagulation - would continue until at least 6 weeks PP. 2.  Anxiety - was started on Klonipin at the time of her PE - stopped with positive pregnancy test but very symptomatic 3.  Interstitial cystitis - was on Macrobid suppression and pyridium - stopped with positive pregnancy test 4.  Pregnancy of uncertain viability    Recommendations 1.  Patient was contracepting at the time of her PE and does not desire another pregnancy. We discussed the option of termination given the risks to maternal health and the recommendation for referral to the [facility] at [facility] should she decide to not continue the pregnancy.  We also discussed the possibility that this pregnancy was not viable - as we are uncertain how far along she should be, I recommended a follow up ultrasound in 1 week to assess viability.  If no fetal pole/heart beat is seen at that time, would again refer to [facility] to discuss her options.  Patient would like to wait for this ultrasound in the hopes that  she would not need to make a decision.  We discussed in the risks associated with pregnancy in the setting of an active clot and anticoagulation.  We reviewed medication risks including bleeding, bruising,  and the rare risk of heparin induced thrombocytopenia (less so with Lovenox).  We briefly discussed options of birth control that would be safe including tubal ligation, vasectomy, Essure, progesterone only devices such as Mirena, Depo Provera or the progestin-only pill.   2.  Will complete the thrombophilia panel including Functional Protein S and C.  Will also check Factor Xa level and cbc. 3.  Patient asked for additional Klonopin and percocet given that she has almost none left and her current provider does not feel comfortable subscribing this to her - scripts for 14 pills of each (to last 1 week until next appointment)  were provided (Klonipin 0.5 mg and percocet 5/325).  The patient had genetic counseling and occasional reports of clefting associated with Klonopin were reviewed. 5.  May restart Macrobid and Pyridium 6.  Will schedule a hematology and NOB appoint at [facility] (patient may cancel if she decides to terminate or pregnancy is nonviable). 7.  Will need follow up CT per the initial report either in 3-6 months (if not pregnant) or postpartum.   Plan:   Genetic Counseling yes    Ultrasound at what gestational ages Monthly > 28 weeks    Antepartum Testing Starting at 52 to 36 weeks    Additional Testing Thrombophilia, Folate/prenatal vitamins    Delivery at what gestational age [redacted] weeks     Total Time Spent with Patient 30 minutes    >50% of visit spent in couseling/coordination of care yes    Office Use Only [id number]  Level 2 ( ) NEW office consult exp prob focused   Coding Description: MATERNAL CONDITIONS/HISTORY INDICATION(S).   Bleeding Disorder and/or Pt on heparin/coumadin/lovenox.  Electronic Signatures: Kirby Funk (MD)  (Signed 28-Jan-13 12:00)  Authored:  Referral, Home Medications, Allergies, Vital Signs/Notes, Consult, Exam, Radiology, Lab/Radiology Notes, Impression, Plan, Billing, Coding Description   Last Updated: 28-Jan-13 12:00 by Kirby Funk (MD)

## 2014-05-14 NOTE — Consult Note (Signed)
PATIENT NAME:  Casey Barry, Casey Barry MR#:  [id number] DATE OF BIRTH:  Sep 23, 1980  DATE OF CONSULTATION:  02/21/2011  REFERRING PHYSICIAN:   CONSULTING PHYSICIAN:  Annice Needy, MD  REASON FOR CONSULTATION: Evaluation for IVC filter placement.   HISTORY OF PRESENT ILLNESS: This is a 34 year old white female who was diagnosed with a pulmonary embolus on 01/02/2011. She was started on Coumadin and her chest pain resolved and improved. Her lower extremity duplex was negative at that time. She was admitted last night with a recurrence of chest pain, similar symptoms to what she was having six weeks ago. Approximately one week ago, she was diagnosed with a pregnancy, which is her seventh, and was started on Lovenox. It would appear that her Lovenox has not been therapeutic and she has had subtherapeutic factor 10A levels having been followed by Dr. Quin Hoop at [facility]. She obviously will not be able to Coumadin during the pregnancy. She reports to me that the high risk obstetrician that she follows with at [facility] has discussed with her options of terminating the pregnancy due to the multiple complicating features associated with it and the fact that she had been on Coumadin for several weeks as well. She is considering this at this time although she has not made up her mind.   PAST MEDICAL HISTORY:  1. Pulmonary embolus diagnosed six weeks ago.  2. Chronic interstitial cystitis. 3. Nephrolithiasis.  4. Anxiety.  OB HISTORY: She is a gravida 8, para 6.   PAST SURGICAL HISTORY:  1. Cystoscopy with hydrodilatation. 2. Hysteroscopy with removal of an IUD.  ALLERGIES: Levaquin causes hives and swelling.   MEDICATIONS:  1. Percocet as needed for pain. 2. Klonopin 0.25 mg twice a day as needed.  3. Macrobid 100 mg twice a day. 4. Lovenox 80 mg subcutaneous every 12 hours having this switched from Coumadin one week ago.   SOCIAL HISTORY: No alcohol or tobacco use. She has six children and is currently  unemployed.   FAMILY HISTORY: Father has hypertension. Several grandparents have stroke and Parkinson's disease.   REVIEW OF SYSTEMS: GENERAL: She felt some subjective fever but no documented fever in the hospital. No chills. No intentional weight loss or gain. EYES: No blurred or double vision. EARS: No tinnitus or ear pain. CARDIOVASCULAR: Chest pain as per history of present illness, also some palpitations. RESPIRATORY: Had some shortness of breath and says this and her chest pain are a little better than they were on admission. No hemoptysis. GASTROINTESTINAL: No nausea, vomiting, or diarrhea. GENITOURINARY: She has chronic dysuria. No changes. ENDOCRINE: No heat or cold intolerance. HEME: No easy bleeding or bruising. History of thrombotic problems. SKIN: No new rashes or ulcers. NEUROLOGIC: No transient ischemic attack, stroke, or seizure. PSYCH: Positive for some anxiety and depression. MUSCULOSKELETAL: No pain or swelling of the arms or legs.   PHYSICAL EXAMINATION:   GENERAL: This is a well-developed, well-nourished white female who is not in apparent distress and not visually pregnant as of yet.  VITAL SIGNS: Temperature 98.7, pulse 79, blood pressure 104/69, and saturations are 98% on room air.   HEAD: Normocephalic, atraumatic.   EYES: Sclerae anicteric. Conjunctivae are clear.   EARS: Normal external appearance. Hearing is intact.   NECK: Supple without adenopathy or jugular venous distention.   HEART: Regular rate and rhythm.   LUNGS: Clear bilaterally.   ABDOMEN: Soft, nondistended, and nontender.   EXTREMITIES: Warm and well perfused without sinus, clubbing, or edema.   PULSE EXAM: 2+  radial and pedal pulses.   NEUROLOGIC: Normal strength and tone in all four extremities.  LABORATORY DATA: Sodium 140, potassium 3.9, chloride 104, CO2 24, BUN 11, creatinine 0.76, glucose 103. White blood cell count 10.9, hemoglobin 12.0, platelet count 186,000.   ASSESSMENT AND PLAN:  This is a 34 year old white female who is six weeks pregnant with pain and shortness of breath that are worrisome for recurrence or progression of pulmonary embolus. This is a pretty difficult situation given her intrauterine pregnancy and that significantly complicates both the anticoagulation regimen and the consideration for IVC filter. An IVC filter in a pregnant patient is a significantly more morbid procedure as it must be placed in the suprarenal location and there is much higher risk of migration and problems at this location. In addition, the cava can dilate with pregnancy and worry about long-term migration. If they are not placed in the suprarenal location, there are reports of fetal demise due to the uterus essentially growing into the area and causing local trauma and obviously this would like to be avoided. She is apparently considering terminating her pregnancy which in some regards would decrease the complication associated with her thromboembolic problems. If she does she could 1) go back on Coumadin which she used without issues or problems for five weeks and 2) an IVC filter would be a less risky scenario. At this point, she has not been therapeutic on her Lovenox as best I can tell so I would not call this a failure of Lovenox merely a fact that she has not been anticoagulated appropriately. I think getting her at a therapeutic level of Lovenox would be a reasonable option, and I have discussed with her I would not recommend an IVC filter at this time in a patient who is pregnant and was not therapeutic on anticoagulation. I will check back with her in a couple of days after she has had time to think over her decision on the termination          of the pregnancy, and she is actually scheduled to follow-up at the [facility] high risk clinic on Monday.   This is a level-4 consultation.  ____________________________ Annice NeedyJason S. Dew, MD jsd:slb D: 02/22/2011 15:28:00 ET T: 02/22/2011 15:48:07  ET JOB#: [id number]  cc: Annice NeedyJason S. Dew, MD, <Dictator> Annice NeedyJASON S DEW MD ELECTRONICALLY SIGNED 03/10/2011 9:21

## 2014-05-14 NOTE — Discharge Summary (Signed)
PATIENT NAME:  Casey Barry, Casey Barry MR#:  [id number] DATE OF BIRTH:  1980/11/16  DATE OF ADMISSION:  02/21/2011 DATE OF DISCHARGE:  02/23/2011  PRIMARY CARE PHYSICIAN: Currently will be at the [facility] high risk. She was seen by Dr. Sherrlyn Hock    FINAL DIAGNOSES:  1. Pulmonary embolism.  2. Chest and back pain, could be musculoskeletal, could be pneumonia.  3. Anxiety.  4. High risk pregnancy.  5. Interstitial cystitis.   MEDICATIONS ON DISCHARGE:  1. Lovenox 80 mg subcutaneous injection twice a day. 2. Percocet 5/325, 1 tablet every six hours as needed for pain.  3. Augmentin 875 mg p.o. twice a day until completion.  4. Clonazepam 0.5 mg p.o. 3 times a day as needed for anxiety.  5. Can restart nitrofurantoin after completion of Augmentin.   FOLLOW UP: Follow up with Dr. Sherrlyn Hock, hematology;  [facility].   REASON FOR ADMISSION: Patient was admitted to the hospital 02/21/2011, discharged 02/23/2011. Patient came in with chest pain, shortness of breath and back pain.   HISTORY OF PRESENT ILLNESS: 34 year old woman history of chronic interstitial cystitis, nephrolithiasis, recent diagnosis of bilateral pulmonary embolism developed chest pain and back pain. She was diagnosed with bilateral PE in December 2012, discharged on Coumadin then ended up becoming pregnant and her warfarin was switched over to Lovenox and her Lovenox was reported subtherapeutic on anti-10A level and came in with symptoms and she was started on heparin drip, admitted to the hospital.   CONSULTATIONS: 1. Dr. Sherrlyn Hock, hematology. 2. Dr. Wyn Quaker, vascular surgery.  LABORATORY, DIAGNOSTIC AND RADIOLOGICAL DATA: EKG showed normal sinus rhythm with sinus arrhythmia, 82 beats per minute. INR 1.0, PT 13.3, PTT 36.1. Troponin negative. Glucose 103, BUN 11, creatinine 0.76, sodium 140, potassium 3.9, chloride 104, CO2 24, calcium 8.8. Liver function tests: AST slightly elevated at 42. White blood cell count 10.9, hemoglobin and  hematocrit 12.0 and 36.1, platelet count 186. D-dimer negative at 0.33. Beta hCG 20,433. Chest x-ray showed a patchy increased density of right midlung, correlate for underlying pneumonia. Ultrasound transvaginal showed a single viable intrauterine pregnancy. Estimated gestational age of six weeks based on a crown rump of 4.2 mm. Ultrasound of the lower extremity bilaterally was negative. Last white count 7.4. Last hemoglobin 11.5, platelet count 171. Last chemistry was within normal limits.   HOSPITAL COURSE PER PROBLEM LIST:  1. For the patient's pulmonary embolism, she was initially started on heparin drip. Patient was seen in consultation by Dr. Sherrlyn Hock who recommended staying on the same dose of the Lovenox after being on heparin for a few days. No further imaging tests were done to evaluate the PE secondary to the pregnancy since we are already on the treatment, could also be progression of the clot; doubt new pulmonary embolism since the patient was not complaining of the shortness of breath. She was switched back to Lovenox which she has at home for continued treatment and follow up at [facility] high risk pregnancy and Dr. Sherrlyn Hock.  2. For her chest and back pain, this did have a musculoskeletal component with pressing on the back so could be musculoskeletal. Looking back at the chest x-ray report could be a pneumonia also. Interestingly enough, the patient did not have a white count, no fever during the hospital course but I will give a course of Augmentin. Patient did not really complain of cough.  3. Anxiety. Patient takes p.r.n. clonazepam. Benefits and risks of any medication need to be weighed with pregnancy. Patient  states that she does not take it too often. 4. For her high risk pregnancy with pulmonary embolism, [facility].  5. For her interstitial cystitis, she can continue the nitrofurantoin when she is done with the Augmentin course.   TIME SPENT ON DISCHARGE: 35  minutes.  ____________________________ Herschell Dimesichard J. Renae GlossWieting, MD rjw:cms D: 02/23/2011 15:14:47 ET T: 02/24/2011 14:04:58 ET  JOB#: [id number] cc: Sandeep R. Sherrlyn HockPandit, M.D. and [facility] Salley ScarletICHARD J Shamaya Kauer MD ELECTRONICALLY SIGNED 03/08/2011 16:39

## 2014-05-29 ENCOUNTER — Encounter (HOSPITAL_COMMUNITY): Payer: Self-pay | Admitting: Nurse Practitioner

## 2014-05-29 ENCOUNTER — Emergency Department (HOSPITAL_COMMUNITY): Payer: No Typology Code available for payment source

## 2014-05-29 ENCOUNTER — Emergency Department (HOSPITAL_COMMUNITY)
Admission: EM | Admit: 2014-05-29 | Discharge: 2014-05-30 | Disposition: A | Discharge status: Home / Self Care | Payer: No Typology Code available for payment source | Attending: Emergency Medicine | Admitting: Emergency Medicine

## 2014-05-29 DIAGNOSIS — Z79899 Other long term (current) drug therapy: Secondary | ICD-10-CM | POA: Insufficient documentation

## 2014-05-29 DIAGNOSIS — S3992XA Unspecified injury of lower back, initial encounter: Secondary | ICD-10-CM | POA: Diagnosis not present

## 2014-05-29 DIAGNOSIS — Z8719 Personal history of other diseases of the digestive system: Secondary | ICD-10-CM | POA: Diagnosis not present

## 2014-05-29 DIAGNOSIS — Z3202 Encounter for pregnancy test, result negative: Secondary | ICD-10-CM | POA: Insufficient documentation

## 2014-05-29 DIAGNOSIS — S0990XA Unspecified injury of head, initial encounter: Secondary | ICD-10-CM | POA: Diagnosis present

## 2014-05-29 DIAGNOSIS — S24109A Unspecified injury at unspecified level of thoracic spinal cord, initial encounter: Secondary | ICD-10-CM | POA: Diagnosis not present

## 2014-05-29 DIAGNOSIS — Y92410 Unspecified street and highway as the place of occurrence of the external cause: Secondary | ICD-10-CM | POA: Diagnosis not present

## 2014-05-29 DIAGNOSIS — T07XXXA Unspecified multiple injuries, initial encounter: Secondary | ICD-10-CM

## 2014-05-29 DIAGNOSIS — S060X0A Concussion without loss of consciousness, initial encounter: Secondary | ICD-10-CM | POA: Diagnosis not present

## 2014-05-29 DIAGNOSIS — Z87448 Personal history of other diseases of urinary system: Secondary | ICD-10-CM | POA: Diagnosis not present

## 2014-05-29 DIAGNOSIS — S7002XA Contusion of left hip, initial encounter: Secondary | ICD-10-CM | POA: Insufficient documentation

## 2014-05-29 DIAGNOSIS — S199XXA Unspecified injury of neck, initial encounter: Secondary | ICD-10-CM | POA: Insufficient documentation

## 2014-05-29 DIAGNOSIS — Z8679 Personal history of other diseases of the circulatory system: Secondary | ICD-10-CM | POA: Diagnosis not present

## 2014-05-29 DIAGNOSIS — Z87442 Personal history of urinary calculi: Secondary | ICD-10-CM | POA: Diagnosis not present

## 2014-05-29 DIAGNOSIS — Z86711 Personal history of pulmonary embolism: Secondary | ICD-10-CM | POA: Diagnosis not present

## 2014-05-29 DIAGNOSIS — Y9389 Activity, other specified: Secondary | ICD-10-CM | POA: Insufficient documentation

## 2014-05-29 DIAGNOSIS — Y998 Other external cause status: Secondary | ICD-10-CM | POA: Insufficient documentation

## 2014-05-29 HISTORY — DX: Other pulmonary embolism without acute cor pulmonale: I26.99

## 2014-05-29 LAB — I-STAT CHEM 8, ED
BUN: 9 mg/dL (ref 6–20)
CALCIUM ION: 1.12 mmol/L (ref 1.12–1.23)
CHLORIDE: 102 mmol/L (ref 101–111)
Creatinine, Ser: 0.7 mg/dL (ref 0.44–1.00)
GLUCOSE: 88 mg/dL (ref 70–99)
HEMATOCRIT: 38 % (ref 36.0–46.0)
Hemoglobin: 12.9 g/dL (ref 12.0–15.0)
Potassium: 3.5 mmol/L (ref 3.5–5.1)
Sodium: 139 mmol/L (ref 135–145)
TCO2: 20 mmol/L (ref 0–100)

## 2014-05-29 LAB — POC URINE PREG, ED: Preg Test, Ur: NEGATIVE

## 2014-05-29 MED ORDER — HYDROCODONE-ACETAMINOPHEN 5-325 MG PO TABS
1.0000 | ORAL_TABLET | Freq: Once | ORAL | Status: AC
Start: 2014-05-29 — End: 2014-05-29
  Administered 2014-05-29: 1 via ORAL
  Filled 2014-05-29: qty 1

## 2014-05-29 MED ORDER — IBUPROFEN 400 MG PO TABS
400.0000 mg | ORAL_TABLET | Freq: Once | ORAL | Status: AC
  Administered 2014-05-29: 400 mg via ORAL

## 2014-05-29 MED ORDER — IBUPROFEN 400 MG PO TABS
ORAL_TABLET | ORAL | Status: AC
  Filled 2014-05-29: qty 1

## 2014-05-29 MED ORDER — OXYCODONE-ACETAMINOPHEN 5-325 MG PO TABS
2.0000 | ORAL_TABLET | Freq: Once | ORAL | Status: AC
  Administered 2014-05-29: 2 via ORAL
  Filled 2014-05-29: qty 2
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
## 2014-05-29 NOTE — ED Notes (Signed)
This nurse attempted 2 times for IV without success.  Spoke with coworker and he will attempt
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
## 2014-05-29 NOTE — ED Notes (Signed)
Pt c/o chest pain, states she has a hx of PE, stopped taking her Lovenox and coumadin a year ago due to insurance issues.   EKG completed.
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
## 2014-05-30 ENCOUNTER — Encounter (HOSPITAL_COMMUNITY): Payer: Self-pay

## 2014-05-30 ENCOUNTER — Emergency Department (HOSPITAL_COMMUNITY): Payer: No Typology Code available for payment source

## 2014-05-30 MED ORDER — IOHEXOL 350 MG/ML SOLN
100.0000 mL | Freq: Once | INTRAVENOUS | Status: AC | PRN
  Administered 2014-05-30: 100 mL via INTRAVENOUS

## 2014-05-30 MED ORDER — NAPROXEN 500 MG PO TABS
500.0000 mg | ORAL_TABLET | Freq: Two times a day (BID) | ORAL | Status: DC
Start: 2014-05-30 — End: 2015-01-21

## 2014-05-30 NOTE — ED Notes (Signed)
Discharge instructions and prescription given and reviewed
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
## 2015-01-21 ENCOUNTER — Emergency Department (HOSPITAL_COMMUNITY)
Admission: EM | Admit: 2015-01-21 | Discharge: 2015-01-21 | Disposition: A | Discharge status: Home / Self Care | Payer: Self-pay | Attending: Emergency Medicine | Admitting: Emergency Medicine

## 2015-01-21 ENCOUNTER — Encounter (HOSPITAL_COMMUNITY): Payer: Self-pay | Admitting: Emergency Medicine

## 2015-01-21 ENCOUNTER — Emergency Department (HOSPITAL_COMMUNITY): Payer: Self-pay

## 2015-01-21 DIAGNOSIS — Z87440 Personal history of urinary (tract) infections: Secondary | ICD-10-CM | POA: Insufficient documentation

## 2015-01-21 DIAGNOSIS — Y92410 Unspecified street and highway as the place of occurrence of the external cause: Secondary | ICD-10-CM | POA: Insufficient documentation

## 2015-01-21 DIAGNOSIS — Z86711 Personal history of pulmonary embolism: Secondary | ICD-10-CM | POA: Insufficient documentation

## 2015-01-21 DIAGNOSIS — Z79899 Other long term (current) drug therapy: Secondary | ICD-10-CM | POA: Insufficient documentation

## 2015-01-21 DIAGNOSIS — T148 Other injury of unspecified body region: Secondary | ICD-10-CM | POA: Insufficient documentation

## 2015-01-21 DIAGNOSIS — S3992XA Unspecified injury of lower back, initial encounter: Secondary | ICD-10-CM | POA: Insufficient documentation

## 2015-01-21 DIAGNOSIS — S299XXA Unspecified injury of thorax, initial encounter: Secondary | ICD-10-CM | POA: Insufficient documentation

## 2015-01-21 DIAGNOSIS — Z8719 Personal history of other diseases of the digestive system: Secondary | ICD-10-CM | POA: Insufficient documentation

## 2015-01-21 DIAGNOSIS — Y9389 Activity, other specified: Secondary | ICD-10-CM | POA: Insufficient documentation

## 2015-01-21 DIAGNOSIS — S0990XA Unspecified injury of head, initial encounter: Secondary | ICD-10-CM | POA: Insufficient documentation

## 2015-01-21 DIAGNOSIS — Z87442 Personal history of urinary calculi: Secondary | ICD-10-CM | POA: Insufficient documentation

## 2015-01-21 DIAGNOSIS — Z8679 Personal history of other diseases of the circulatory system: Secondary | ICD-10-CM | POA: Insufficient documentation

## 2015-01-21 DIAGNOSIS — Y998 Other external cause status: Secondary | ICD-10-CM | POA: Insufficient documentation

## 2015-01-21 DIAGNOSIS — T148XXA Other injury of unspecified body region, initial encounter: Secondary | ICD-10-CM

## 2015-01-21 MED ORDER — HYDROXYZINE HCL 25 MG PO TABS
50.0000 mg | ORAL_TABLET | Freq: Once | ORAL | Status: AC
  Administered 2015-01-21: 50 mg via ORAL
  Filled 2015-01-21: qty 2

## 2015-01-21 MED ORDER — OXYCODONE-ACETAMINOPHEN 5-325 MG PO TABS
1.0000 | ORAL_TABLET | Freq: Once | ORAL | Status: AC
  Administered 2015-01-21: 1 via ORAL
  Filled 2015-01-21: qty 1

## 2015-01-21 MED ORDER — HYDROCODONE-ACETAMINOPHEN 5-325 MG PO TABS: 1.0000 | ORAL_TABLET | ORAL | Status: DC | PRN

## 2015-01-21 NOTE — ED Notes (Signed)
Pt left with all her belongings and ambulated out of the treatment area.  
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
## 2015-01-21 NOTE — Discharge Instructions (Signed)
Chest Contusion A chest contusion is a deep bruise on your chest area. Contusions are the result of an injury that caused bleeding under the skin. A chest contusion may involve bruising of the skin, muscles, or ribs. The contusion may turn blue, purple, or yellow. Minor injuries will give you a painless contusion, but more severe contusions may stay painful and swollen for a few weeks. CAUSES  A contusion is usually caused by a blow, trauma, or direct force to an area of the body. SYMPTOMS   Swelling and redness of the injured area.  Discoloration of the injured area.  Tenderness and soreness of the injured area.  Pain. DIAGNOSIS  The diagnosis can be made by taking a history and performing a physical exam. An X-ray, CT scan, or MRI may be needed to determine if there were any associated injuries, such as broken bones (fractures) or internal injuries. TREATMENT  Often, the best treatment for a chest contusion is resting, icing, and applying cold compresses to the injured area. Deep breathing exercises may be recommended to reduce the risk of pneumonia. Over-the-counter medicines may also be recommended for pain control. HOME CARE INSTRUCTIONS   Put ice on the injured area.  Put ice in a plastic bag.  Place a towel between your skin and the bag.  Leave the ice on for 15-20 minutes, 03-04 times a day.  Only take over-the-counter or prescription medicines as directed by your caregiver. Your caregiver may recommend avoiding anti-inflammatory medicines (aspirin, ibuprofen, and naproxen) for 48 hours because these medicines may increase bruising.  Rest the injured area.  Perform deep-breathing exercises as directed by your caregiver.  Stop smoking if you smoke.  Do not lift objects over 5 pounds (2.3 kg) for 3 days or longer if recommended by your caregiver. SEEK IMMEDIATE MEDICAL CARE IF:   You have increased bruising or swelling.  You have pain that is getting worse.  You have  difficulty breathing.  You have dizziness, weakness, or fainting.  You have blood in your urine or stool.  You cough up or vomit blood.  Your swelling or pain is not relieved with medicines. MAKE SURE YOU:   Understand these instructions.  Will watch your condition.  Will get help right away if you are not doing well or get worse.   This information is not intended to replace advice given to you by your health care provider. Make sure you discuss any questions you have with your health care provider.   Document Released: 10/01/2000 Document Revised: 10/01/2011 Document Reviewed: 06/30/2011 Elsevier Interactive Patient Education 2016 [employer] Inc.  
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
## 2015-05-23 ENCOUNTER — Telehealth: Payer: Self-pay | Admitting: *Deleted

## 2015-05-23 NOTE — Telephone Encounter (Addendum)
error 

## 2015-05-29 ENCOUNTER — Encounter: Payer: Self-pay | Admitting: Emergency Medicine

## 2015-05-29 ENCOUNTER — Emergency Department
Admission: EM | Admit: 2015-05-29 | Discharge: 2015-05-29 | Disposition: A | Discharge status: Home / Self Care | Payer: No Typology Code available for payment source | Attending: Emergency Medicine | Admitting: Emergency Medicine

## 2015-05-29 DIAGNOSIS — L0591 Pilonidal cyst without abscess: Secondary | ICD-10-CM | POA: Insufficient documentation

## 2015-05-29 DIAGNOSIS — Z8679 Personal history of other diseases of the circulatory system: Secondary | ICD-10-CM | POA: Insufficient documentation

## 2015-05-29 MED ORDER — OXYCODONE-ACETAMINOPHEN 5-325 MG PO TABS
1.0000 | ORAL_TABLET | Freq: Once | ORAL | Status: AC
  Administered 2015-05-29: 1 via ORAL
  Filled 2015-05-29: qty 1

## 2015-05-29 MED ORDER — KETOROLAC TROMETHAMINE 60 MG/2ML IM SOLN
60.0000 mg | Freq: Once | INTRAMUSCULAR | Status: AC
  Administered 2015-05-29: 60 mg via INTRAMUSCULAR
  Filled 2015-05-29: qty 2

## 2015-05-29 MED ORDER — SULFAMETHOXAZOLE-TRIMETHOPRIM 800-160 MG PO TABS: 1.0000 | ORAL_TABLET | Freq: Two times a day (BID) | ORAL | Status: DC

## 2015-05-29 MED ORDER — OXYCODONE-ACETAMINOPHEN 5-325 MG PO TABS: 1.0000 | ORAL_TABLET | ORAL | Status: DC | PRN

## 2015-05-29 NOTE — ED Notes (Signed)
Pt to ed with c/o abscess to buttock x 1 week.

## 2015-05-29 NOTE — Discharge Instructions (Signed)
Pilonidal Cyst  A pilonidal cyst is a fluid-filled sac. It forms beneath the skin near your tailbone, at the top of the crease of your buttocks. A pilonidal cyst that is not large or infected may not cause symptoms or problems.  If the cyst becomes irritated or infected, it may fill with pus. This causes pain and swelling (pilonidal abscess). An infected cyst may need to be treated with medicine, drained, or removed.  CAUSES  The cause of a pilonidal cyst is not known. One cause may be a hair that grows into your skin (ingrown hair).  RISK FACTORS  Pilonidal cysts are more common in boys and men. Risk factors include:  · Having lots of hair near the crease of the buttocks.  · Being overweight.  · Having a pilonidal dimple.  · Wearing tight clothing.  · Not bathing or showering frequently.  · Sitting for long periods of time.  SIGNS AND SYMPTOMS  Signs and symptoms of a pilonidal cyst may include:  · Redness.  · Pain and tenderness.  · Warmth.  · Swelling.  · Pus.  · Fever.  DIAGNOSIS  Your health care provider may diagnose a pilonidal cyst based on your symptoms and a physical exam. The health care provider may do a blood test to check for infection. If your cyst is draining pus, your health care provider may take a sample of the drainage to be tested at a laboratory.  TREATMENT  Surgery is the usual treatment for an infected pilonidal cyst. You may also have to take medicines before surgery. The type of surgery you have depends on the size and severity of the infected cyst. The different kinds of surgery include:  · Incision and drainage. This is a procedure to open and drain the cyst.  · Marsupialization. In this procedure, a large cyst or abscess may be opened and kept open by stitching the edges of the skin to the cyst walls.  · Cyst removal. This procedure involves opening the skin and removing all or part of the cyst.  HOME CARE INSTRUCTIONS  · Follow all of your surgeon's instructions carefully if you had  surgery.  · Take medicines only as directed by your health care provider.  · If you were prescribed an antibiotic medicine, finish it all even if you start to feel better.  · Keep the area around your pilonidal cyst clean and dry.  · Clean the area as directed by your health care provider. Pat the area dry with a clean towel. Do not rub it as this may cause bleeding.  · Remove hair from the area around the cyst as directed by your health care provider.  · Do not wear tight clothing or sit in one place for long periods of time.  · There are many different ways to close and cover an incision, including stitches, skin glue, and adhesive strips. Follow your health care provider's instructions on:    Incision care.    Bandage (dressing) changes and removal.    Incision closure removal.  SEEK MEDICAL CARE IF:   · You have drainage, redness, swelling, or pain at the site of the cyst.  · You have a fever.     This information is not intended to replace advice given to you by your health care provider. Make sure you discuss any questions you have with your health care provider.     Document Released: 01/04/2000 Document Revised: 01/27/2014 Document Reviewed: 05/26/2013  Elsevier Interactive Patient   Education ©2016 Elsevier Inc.  -

## 2015-05-29 NOTE — ED Notes (Signed)
See triage note   States she felt a small raised area to buttocks about 1 week ago. Now area is larger and inside left buttock area

## 2015-05-29 NOTE — ED Provider Notes (Signed)
[facility] Emergency Department Provider Note  ____________________________________________  Time seen: Approximately 4:23 PM  I have reviewed the triage vital signs and the nursing notes.   HISTORY  Chief Complaint Abscess    HPI Casey Barry is a 35 y.o. female who presents for evaluation of a small raised area to her buttocks approximately one week ago. Patient states that she tried squeezing the area is now larger and located on the inside of her left buttocks. Denies any fever chills and reports never having this before.   Past Medical History  Diagnosis Date  . Cystitis   . Gall stones   . Migraines   . Kidney stones   . Pulmonary embolism (HCC)     There are no active problems to display for this patient.   Past Surgical History  Procedure Laterality Date  . Tonsillectomy      Current Outpatient Rx  Name  Route  Sig  Dispense  Refill  . acetaminophen (TYLENOL) 500 MG tablet   Oral   Take 1,000 mg by mouth every 6 (six) hours as needed for mild pain or moderate pain.         . clonazePAM (KLONOPIN) 2 MG tablet   Oral   Take 2 mg by mouth daily as needed for anxiety.         Marland Kitchen. oxyCODONE-acetaminophen (ROXICET) 5-325 MG tablet   Oral   Take 1-2 tablets by mouth every 4 (four) hours as needed for severe pain.   15 tablet   0   . phentermine 37.5 MG capsule   Oral   Take 37.5 mg by mouth daily.         Marland Kitchen. sulfamethoxazole-trimethoprim (BACTRIM DS,SEPTRA DS) 800-160 MG tablet   Oral   Take 1 tablet by mouth 2 (two) times daily.   20 tablet   0     Allergies Levaquin  History reviewed. No pertinent family history.  Social History Social History  Substance Use Topics  . Smoking status: Never Smoker   . Smokeless tobacco: None  . Alcohol Use: No    Review of Systems Constitutional: No fever/chills Gastrointestinal: No abdominal pain.  No nausea, no vomiting.  No diarrhea.  No constipation. Genitourinary:  Negative for dysuria. Musculoskeletal: Negative for back pain. Skin: Positive for raised erythematous lesion to buttocks Neurological: Negative for headaches, focal weakness or numbness.  10-point ROS otherwise negative.  ____________________________________________   PHYSICAL EXAM:  VITAL SIGNS: ED Triage Vitals  Enc Vitals Group     BP 05/29/15 1251 111/75 mmHg     Pulse Rate 05/29/15 1251 91     Resp 05/29/15 1251 17     Temp 05/29/15 1251 98.5 F (36.9 C)     Temp Source 05/29/15 1251 Oral     SpO2 05/29/15 1251 100 %     Weight 05/29/15 1251 164 lb (74.39 kg)     Height 05/29/15 1251 5\' 2"  (1.575 m)     Head Cir --      Peak Flow --      Pain Score 05/29/15 1306 10     Pain Loc --      Pain Edu? --      Excl. in GC? --     Constitutional: Alert and oriented. Well appearing and in no acute distress.  Musculoskeletal: No lower extremity tenderness nor edema.  No joint effusions. Neurologic:  Normal speech and language. No gross focal neurologic deficits are appreciated. No gait instability.  Skin:  Skin is warm, dry and intact. 2cm area of erythema with  Firmness noted about 4 cm subcutaneously. Minimal tenderness, no obvious drainage. Psychiatric: Mood and affect are normal. Speech and behavior are normal.  ____________________________________________   LABS (all labs ordered are listed, but only abnormal results are displayed)  Labs Reviewed - No data to display ____________________________________________    PROCEDURES  Procedure(s) performed: None  Critical Care performed: No  ____________________________________________   INITIAL IMPRESSION / ASSESSMENT AND PLAN / ED COURSE  Pertinent labs & imaging results that were available during my care of the patient were reviewed by me and considered in my medical decision making (see chart for details).  Pilonidal cyst. Rx given for Bactrim DS twice a day 10 days, Percocet 5/325. Encouraged the use of sitz  baths. Encourage patient not to squeeze the abscess. Return to the ED or follow up with PCP in 72 hours for wound recheck if needed. ____________________________________________   FINAL CLINICAL IMPRESSION(S) / ED DIAGNOSES  Final diagnoses:  Pilonidal cyst     This chart was dictated using voice recognition software/Dragon. Despite best efforts to proofread, errors can occur which can change the meaning. Any change was purely unintentional.   Evangeline Dakin, PA-C 05/29/15 1627  Phineas Semen, MD 05/29/15 1757

## 2015-05-31 ENCOUNTER — Encounter: Payer: Self-pay | Admitting: Emergency Medicine

## 2015-05-31 ENCOUNTER — Emergency Department
Admission: EM | Admit: 2015-05-31 | Discharge: 2015-05-31 | Disposition: A | Discharge status: Home / Self Care | Payer: No Typology Code available for payment source | Attending: Emergency Medicine | Admitting: Emergency Medicine

## 2015-05-31 DIAGNOSIS — L0231 Cutaneous abscess of buttock: Secondary | ICD-10-CM | POA: Insufficient documentation

## 2015-05-31 DIAGNOSIS — Z4800 Encounter for change or removal of nonsurgical wound dressing: Secondary | ICD-10-CM | POA: Insufficient documentation

## 2015-05-31 DIAGNOSIS — Z79899 Other long term (current) drug therapy: Secondary | ICD-10-CM | POA: Insufficient documentation

## 2015-05-31 DIAGNOSIS — Z7689 Persons encountering health services in other specified circumstances: Secondary | ICD-10-CM

## 2015-05-31 DIAGNOSIS — Z0189 Encounter for other specified special examinations: Secondary | ICD-10-CM

## 2015-05-31 MED ORDER — OXYCODONE-ACETAMINOPHEN 5-325 MG PO TABS
1.0000 | ORAL_TABLET | ORAL | Status: DC | PRN
  Administered 2015-05-31: 1 via ORAL

## 2015-05-31 MED ORDER — LIDOCAINE HCL (PF) 1 % IJ SOLN
INTRAMUSCULAR | Status: DC
Start: 2015-05-31 — End: 2015-05-31
  Filled 2015-05-31: qty 5

## 2015-05-31 MED ORDER — OXYCODONE-ACETAMINOPHEN 5-325 MG PO TABS
ORAL_TABLET | ORAL | Status: AC
  Administered 2015-05-31: 1 via ORAL
  Filled 2015-05-31: qty 1

## 2015-05-31 MED ORDER — TRAMADOL HCL 50 MG PO TABS: 50.0000 mg | ORAL_TABLET | Freq: Three times a day (TID) | ORAL | Status: DC | PRN

## 2015-05-31 MED ORDER — SULFAMETHOXAZOLE-TRIMETHOPRIM 800-160 MG PO TABS: 1.0000 | ORAL_TABLET | Freq: Two times a day (BID) | ORAL | Status: DC

## 2015-05-31 NOTE — Discharge Instructions (Signed)
Abscess °An abscess (boil or furuncle) is an infected area on or under the skin. This area is filled with yellowish-white fluid (pus) and other material (debris). °HOME CARE  °· Only take medicines as told by your doctor. °· If you were given antibiotic medicine, take it as directed. Finish the medicine even if you start to feel better. °· If gauze is used, follow your doctor's directions for changing the gauze. °· To avoid spreading the infection: °· Keep your abscess covered with a bandage. °· Wash your hands well. °· Do not share personal care items, towels, or whirlpools with others. °· Avoid skin contact with others. °· Keep your skin and clothes clean around the abscess. °· Keep all doctor visits as told. °GET HELP RIGHT AWAY IF:  °· You have more pain, puffiness (swelling), or redness in the wound site. °· You have more fluid or blood coming from the wound site. °· You have muscle aches, chills, or you feel sick. °· You have a fever. °MAKE SURE YOU:  °· Understand these instructions. °· Will watch your condition. °· Will get help right away if you are not doing well or get worse. °  °This information is not intended to replace advice given to you by your health care provider. Make sure you discuss any questions you have with your health care provider. °  °Document Released: 06/25/2007 Document Revised: 07/08/2011 Document Reviewed: 03/22/2011 °Elsevier Interactive Patient Education ©2016 Elsevier Inc. ° °Incision and Drainage °Incision and drainage is a procedure in which a sac-like structure (cystic structure) is opened and drained. The area to be drained usually contains material such as pus, fluid, or blood.  °LET YOUR CAREGIVER KNOW ABOUT:  °· Allergies to medicine. °· Medicines taken, including vitamins, herbs, eyedrops, over-the-counter medicines, and creams. °· Use of steroids (by mouth or creams). °· Previous problems with anesthetics or numbing medicines. °· History of bleeding problems or blood  clots. °· Previous surgery. °· Other health problems, including diabetes and kidney problems. °· Possibility of pregnancy, if this applies. °RISKS AND COMPLICATIONS °· Pain. °· Bleeding. °· Scarring. °· Infection. °BEFORE THE PROCEDURE  °You may need to have an ultrasound or other imaging tests to see how large or deep your cystic structure is. Blood tests may also be used to determine if you have an infection or how severe the infection is. You may need to have a tetanus shot. °PROCEDURE  °The affected area is cleaned with a cleaning fluid. The cyst area will then be numbed with a medicine (local anesthetic). A small incision will be made in the cystic structure. A syringe or catheter may be used to drain the contents of the cystic structure, or the contents may be squeezed out. The area will then be flushed with a cleansing solution. After cleansing the area, it is often gently packed with a gauze or another wound dressing. Once it is packed, it will be covered with gauze and tape or some other type of wound dressing.  °AFTER THE PROCEDURE  °· Often, you will be allowed to go home right after the procedure. °· You may be given antibiotic medicine to prevent or heal an infection. °· If the area was packed with gauze or some other wound dressing, you will likely need to come back in 1 to 2 days to get it removed. °· The area should heal in about 14 days. °  °This information is not intended to replace advice given to you by your health care provider.   Make sure you discuss any questions you have with your health care provider.   Document Released: 07/02/2000 Document Revised: 07/08/2011 Document Reviewed: 03/03/2011 Elsevier Interactive Patient Education [employer]2016 Elsevier Inc.   Keep the wound clean, dry, and covered. Apply warm compresses to promote healing. Take the antibiotic, 2 tabs per dose, morning and night. Remove the packing in 3 days or return for wound check as needed.

## 2015-05-31 NOTE — ED Notes (Signed)
States she was seen 2 daysa go with abscess to buttocks  States area is larger now  With increased pain

## 2015-05-31 NOTE — ED Notes (Signed)
Pt present with pilonidal cyst to buttocks. Pt states seen last week and given antibiotics. Pt states cyst is getting bigger and more painful.

## 2015-05-31 NOTE — ED Provider Notes (Signed)
[facility] Emergency Department Provider Note ____________________________________________  Time seen: 1151  I have reviewed the triage vital signs and the nursing notes.  HISTORY  Chief Complaint  Cyst  HPI Casey Barry is a 35 y.o. female sensitivity ED for reevaluation of a cyst to the left buttocks, initially treated here 3 days prior to arrival. She was started on Bactrim and pain medicines and advised that the current presentation was not appropriate for I&D of the time. She returns today having apply warm compresses and dosed antibodies as present prescribed. She reports the area has been increasingly tender and is aware of some spontaneous drainage. She does admit to some subjective fevers and sweats in the interim. She also started with some headaches. She rates her overall discomfort at a 10/10 in triage.  Past Medical History  Diagnosis Date  . Cystitis   . Gall stones   . Migraines   . Kidney stones   . Pulmonary embolism (HCC)     There are no active problems to display for this patient.   Past Surgical History  Procedure Laterality Date  . Tonsillectomy      Current Outpatient Rx  Name  Route  Sig  Dispense  Refill  . acetaminophen (TYLENOL) 500 MG tablet   Oral   Take 1,000 mg by mouth every 6 (six) hours as needed for mild pain or moderate pain.         . clonazePAM (KLONOPIN) 2 MG tablet   Oral   Take 2 mg by mouth daily as needed for anxiety.         Marland Kitchen. oxyCODONE-acetaminophen (ROXICET) 5-325 MG tablet   Oral   Take 1-2 tablets by mouth every 4 (four) hours as needed for severe pain.   15 tablet   0   . phentermine 37.5 MG capsule   Oral   Take 37.5 mg by mouth daily.         Marland Kitchen. sulfamethoxazole-trimethoprim (BACTRIM DS,SEPTRA DS) 800-160 MG tablet   Oral   Take 1 tablet by mouth 2 (two) times daily.   20 tablet   0   . sulfamethoxazole-trimethoprim (BACTRIM DS,SEPTRA DS) 800-160 MG tablet   Oral   Take 1  tablet by mouth 2 (two) times daily.   20 tablet   0   . traMADol (ULTRAM) 50 MG tablet   Oral   Take 1 tablet (50 mg total) by mouth 3 (three) times daily as needed.   15 tablet   0    Allergies Levaquin  No family history on file.  Social History Social History  Substance Use Topics  . Smoking status: Never Smoker   . Smokeless tobacco: None  . Alcohol Use: No   Review of Systems  Constitutional: Positive for subjective fevers. Gastrointestinal: Negative for abdominal pain, vomiting and diarrhea. Musculoskeletal: Negative for back pain. Skin: Negative for rash. Buttock abscess as above.  Neurological: Negative for headaches, focal weakness or numbness. ____________________________________________  PHYSICAL EXAM:  VITAL SIGNS: ED Triage Vitals  Enc Vitals Group     BP 05/31/15 0956 98/60 mmHg     Pulse Rate 05/31/15 0956 94     Resp 05/31/15 0956 18     Temp 05/31/15 0956 98.6 F (37 C)     Temp Source 05/31/15 0956 Oral     SpO2 05/31/15 0956 99 %     Weight 05/31/15 0956 162 lb (73.483 kg)     Height 05/31/15 0956 5' 1.5" (1.562  m)     Head Cir --      Peak Flow --      Pain Score 05/31/15 1005 10     Pain Loc --      Pain Edu? --      Excl. in GC? --    Constitutional: Alert and oriented. Well appearing and in no distress. Head: Normocephalic and atraumatic. Respiratory: Normal respiratory effort.  Musculoskeletal: Nontender with normal range of motion in all extremities.  Neurologic:  Normal gait without ataxia. Normal speech and language. No gross focal neurologic deficits are appreciated. Skin:  Skin is warm, dry and intact. No rash noted. Pointing abscess to the left buttock with spontaneous drainage.  ____________________________________________  INCISION AND DRAINAGE Performed by: Lissa Hoard Consent: Verbal consent obtained. Risks and benefits: risks, benefits and alternatives were discussed Type: abscess  Body area: left  buttock  Anesthesia: local infiltration  Incision was made with a scalpel.  Local anesthetic: lidocaine 1% w/o epinephrine  Anesthetic total: 5 ml  Complexity: complex Blunt dissection to break up loculations  Drainage: purulent  Drainage amount: moderate  Packing material: 1/4 in iodoform gauze  Patient tolerance: Patient tolerated the procedure well with no immediate complications. ____________________________________________  INITIAL IMPRESSION / ASSESSMENT AND PLAN / ED COURSE  Patient with a left buttocks abscess status post I&D procedure. The wound was appropriately packed and dressed and the patient tolerated procedure well. She is encouraged to continue with the previously prescribed antibiotic, however she will be up dose to 2 tabs per dose until completed. She will continue with the Percocet as needed or she may dose Ultram for pain relief in the interim. She is encouraged to apply warm compresses to promote healing. She should remove the pack in 2-3 days and return to the ED as needed for wound recheck. ____________________________________________  FINAL CLINICAL IMPRESSION(S) / ED DIAGNOSES  Final diagnoses:  Left buttock abscess  Encounter for incision and drainage procedure      Lissa Hoard, PA-C 05/31/15 1916  Governor Rooks, MD 06/02/15 [phone]

## 2015-06-11 ENCOUNTER — Emergency Department (HOSPITAL_COMMUNITY): Payer: No Typology Code available for payment source

## 2015-06-11 ENCOUNTER — Emergency Department (HOSPITAL_COMMUNITY)
Admission: EM | Admit: 2015-06-11 | Discharge: 2015-06-11 | Disposition: A | Discharge status: Home / Self Care | Payer: No Typology Code available for payment source | Attending: Emergency Medicine | Admitting: Emergency Medicine

## 2015-06-11 ENCOUNTER — Encounter (HOSPITAL_COMMUNITY): Payer: Self-pay | Admitting: Adult Health

## 2015-06-11 DIAGNOSIS — Z792 Long term (current) use of antibiotics: Secondary | ICD-10-CM | POA: Insufficient documentation

## 2015-06-11 DIAGNOSIS — R630 Anorexia: Secondary | ICD-10-CM | POA: Insufficient documentation

## 2015-06-11 DIAGNOSIS — Z87442 Personal history of urinary calculi: Secondary | ICD-10-CM | POA: Insufficient documentation

## 2015-06-11 DIAGNOSIS — R079 Chest pain, unspecified: Secondary | ICD-10-CM

## 2015-06-11 DIAGNOSIS — Z86711 Personal history of pulmonary embolism: Secondary | ICD-10-CM | POA: Insufficient documentation

## 2015-06-11 DIAGNOSIS — Z79899 Other long term (current) drug therapy: Secondary | ICD-10-CM | POA: Insufficient documentation

## 2015-06-11 DIAGNOSIS — R0602 Shortness of breath: Secondary | ICD-10-CM | POA: Insufficient documentation

## 2015-06-11 DIAGNOSIS — G43909 Migraine, unspecified, not intractable, without status migrainosus: Secondary | ICD-10-CM | POA: Insufficient documentation

## 2015-06-11 DIAGNOSIS — R61 Generalized hyperhidrosis: Secondary | ICD-10-CM | POA: Insufficient documentation

## 2015-06-11 LAB — I-STAT TROPONIN, ED: TROPONIN I, POC: 0 ng/mL (ref 0.00–0.08)

## 2015-06-11 LAB — CBC
HCT: 34.9 % — ABNORMAL LOW (ref 36.0–46.0)
Hemoglobin: 11.5 g/dL — ABNORMAL LOW (ref 12.0–15.0)
MCH: 30.6 pg (ref 26.0–34.0)
MCHC: 33 g/dL (ref 30.0–36.0)
MCV: 92.8 fL (ref 78.0–100.0)
Platelets: 243 10*3/uL (ref 150–400)
RBC: 3.76 MIL/uL — ABNORMAL LOW (ref 3.87–5.11)
RDW: 12.3 % (ref 11.5–15.5)
WBC: 5.7 10*3/uL (ref 4.0–10.5)

## 2015-06-11 LAB — BASIC METABOLIC PANEL
ANION GAP: 8 (ref 5–15)
BUN: 11 mg/dL (ref 6–20)
CALCIUM: 9.1 mg/dL (ref 8.9–10.3)
CO2: 23 mmol/L (ref 22–32)
Chloride: 105 mmol/L (ref 101–111)
Creatinine, Ser: 0.86 mg/dL (ref 0.44–1.00)
GFR calc Af Amer: >60 mL/min (ref >60)
GLUCOSE: 135 mg/dL — AB (ref 65–99)
Potassium: 3.3 mmol/L — ABNORMAL LOW (ref 3.5–5.1)
Sodium: 136 mmol/L (ref 135–145)

## 2015-06-11 LAB — CBG MONITORING, ED: Glucose-Capillary: 158 mg/dL — ABNORMAL HIGH (ref 65–99)

## 2015-06-11 LAB — D-DIMER, QUANTITATIVE (NOT AT ARMC)

## 2015-06-11 NOTE — ED Provider Notes (Signed)
CSN: [id number]     Arrival date & time 06/11/15  2041 History   First MD Initiated Contact with Patient 06/11/15 2106     Chief Complaint  Patient presents with  . Chest Pain   HPI Comments: 35 year old female presents with chest pain since this afternoon. PMH significant for DVT/PE d/t birth control 3 years ago. She is currently on Nexplanon. She started to have substernal chest pain while at work today. The pain was constant, non-radiating, and felt like her "chest was tearing apart". She has never had this pain before. It doesn't feel like when she had a PE because she had SOB with that where as she is not SOB currently. She is taking Phentermine intermittently for weight loss. She has not eaten in 5 days due to depressed mood from her grandmother's death in 01/31/2022 which is still causing her emotional stress. EMS was called to scene, CBG was 44 which improved to 120 with oral glucose. She was also hypotensive at 93/48 and had positive orthostatic vital signs. She reports associated diaphoresis and minimal SOB. Denies fever, chills, palpitations, cough, wheezing, abdominal pain, N/V/D, leg swelling or erythema. She states she didn't have clinical signs of a DVT when she had a PE. Denies hx of HLD, HTN, DM, tobacco use.  Patient is a 35 y.o. female presenting with chest pain.  Chest Pain Associated symptoms: diaphoresis and shortness of breath   Associated symptoms: no abdominal pain, no cough, no fever, no nausea, no palpitations and not vomiting     Past Medical History  Diagnosis Date  . Cystitis   . Gall stones   . Migraines   . Kidney stones   . Pulmonary embolism [facility])    Past Surgical History  Procedure Laterality Date  . Tonsillectomy     History reviewed. No pertinent family history. Social History  Substance Use Topics  . Smoking status: Never Smoker   . Smokeless tobacco: None  . Alcohol Use: No   OB History    No data available     Review of Systems   Constitutional: Positive for diaphoresis and appetite change. Negative for fever and chills.  Respiratory: Positive for shortness of breath. Negative for cough and wheezing.   Cardiovascular: Positive for chest pain. Negative for palpitations and leg swelling.  Gastrointestinal: Negative for nausea, vomiting, abdominal pain and diarrhea.  Psychiatric/Behavioral: Positive for dysphoric mood.  All other systems reviewed and are negative.   Allergies  Levaquin  Home Medications   Prior to Admission medications   Medication Sig Start Date End Date Taking? Authorizing Provider  acetaminophen (TYLENOL) 500 MG tablet Take 1,000 mg by mouth every 6 (six) hours as needed for mild pain or moderate pain.    Historical Provider, MD  clonazePAM (KLONOPIN) 2 MG tablet Take 2 mg by mouth daily as needed for anxiety.    Historical Provider, MD  oxyCODONE-acetaminophen (ROXICET) 5-325 MG tablet Take 1-2 tablets by mouth every 4 (four) hours as needed for severe pain. 05/29/15   Charmayne Sheer Beers, PA-C  phentermine 37.5 MG capsule Take 37.5 mg by mouth daily.    Historical Provider, MD  sulfamethoxazole-trimethoprim (BACTRIM DS,SEPTRA DS) 800-160 MG tablet Take 1 tablet by mouth 2 (two) times daily. 05/29/15   Charmayne Sheer Beers, PA-C  sulfamethoxazole-trimethoprim (BACTRIM DS,SEPTRA DS) 800-160 MG tablet Take 1 tablet by mouth 2 (two) times daily. 05/31/15   Jenise V Bacon Menshew, PA-C  traMADol (ULTRAM) 50 MG tablet Take 1 tablet (50  mg total) by mouth 3 (three) times daily as needed. 05/31/15   Jenise V Bacon Menshew, PA-C   BP 93/48 mmHg  Pulse 83  Temp(Src) 98.5 F (36.9 C) (Oral)  Resp 16  SpO2 99%  LMP 06/09/2015 (Exact Date)   Physical Exam  Constitutional: She is oriented to person, place, and time. She appears well-developed and well-nourished. No distress.  HENT:  Head: Normocephalic and atraumatic.  Eyes: Conjunctivae are normal. Pupils are equal, round, and reactive to light. Right eye exhibits  no discharge. Left eye exhibits no discharge. No scleral icterus.  Neck: Normal range of motion.  Cardiovascular: Normal rate, regular rhythm and intact distal pulses.  Exam reveals no gallop and no friction rub.   No murmur heard. Pulmonary/Chest: Effort normal and breath sounds normal. No respiratory distress. She has no wheezes. She has no rales. She exhibits no tenderness.  Abdominal: Soft. Bowel sounds are normal. She exhibits no distension and no mass. There is no tenderness. There is no rebound and no guarding.  Neurological: She is alert and oriented to person, place, and time.  Skin: Skin is warm and dry.  Psychiatric: She has a normal mood and affect.    ED Course  Procedures (including critical care time) Labs Review Labs Reviewed  BASIC METABOLIC PANEL - Abnormal; Notable for the following:    Potassium 3.3 (*)    Glucose, Bld 135 (*)    All other components within normal limits  CBC - Abnormal; Notable for the following:    RBC 3.76 (*)    Hemoglobin 11.5 (*)    HCT 34.9 (*)    All other components within normal limits  CBG MONITORING, ED - Abnormal; Notable for the following:    Glucose-Capillary 158 (*)    All other components within normal limits  D-DIMER, QUANTITATIVE (NOT AT [facility])  Rosezena SensorI-STAT TROPOININ, ED    Imaging Review Dg Chest 2 View  06/11/2015  CLINICAL DATA:  Acute onset of generalized chest pain. Initial encounter. EXAM: CHEST  2 VIEW COMPARISON:  Chest radiograph from 01/21/2015 FINDINGS: The lungs are well-aerated. Pulmonary vascularity is at the upper limits of normal. There is no evidence of focal opacification, pleural effusion or pneumothorax. The heart is normal in size; the mediastinal contour is within normal limits. No acute osseous abnormalities are seen. A right-sided metallic nipple piercing is noted. IMPRESSION: No acute cardiopulmonary process seen. Electronically Signed   By: Roanna RaiderJeffery  Chang M.D.   On: 06/11/2015 22:22   I have personally  reviewed and evaluated these images and lab results as part of my medical decision-making.   EKG Interpretation   Date/Time:  Monday Jun 11 2015 20:46:50 EDT Ventricular Rate:  74 PR Interval:  145 QRS Duration: 86 QT Interval:  391 QTC Calculation: 434 R Axis:   49 Text Interpretation:  Sinus rhythm Normal ECG Confirmed by MILLER  MD,  BRIAN ([id number]) on 06/11/2015 11:20:43 PM      MDM   Final diagnoses:  Chest pain, unspecified chest pain type    35 year old female presents with chest pain. CBG is improved from 44 to 158. Her BP is on the low side ranging from SBP of 92-111. She is afebrile, not hypoxic, no tachycardia, not tachypneic. Chest pain work up is reassuring. EKG is NSR. CXR is negative. Troponin is 0. Labs are unremarkable. No hx of heart disease. Patient is non-smoker. HEART score is 0. Due to her hx of PE a d-dimer was obtained which was normal. IVF  given and CBG has improved. Recommend Cardiology f/u. Patient is NAD, non-toxic, with stable VS. Patient is informed of clinical course, understands medical decision making process, and agrees with plan. Opportunity for questions provided and all questions answered. Return precautions given.   Bethel Born, PA-C 06/12/15 0014  Eber Hong, MD 06/12/15 [phone]

## 2015-06-11 NOTE — ED Notes (Signed)
CBG 158, Hydrographic surveyorJennaya RN Aware

## 2015-06-11 NOTE — ED Notes (Addendum)
Presents with substernal chest pain began at 1 pm this afternoon. Endorses not eating or drinking much for 5 days-she takes pheneramine for weight loss. Positive for orthostatic changes. CBG was 44 by EMS, given OJ asnd oral glucose-CBG 120. She took one hydrocodone today. She is hypotensive at this time sitting 93/48. Alert, oriented. Endorses high stress lately. HX of blood clots 3 years ago-denies SOB. Chest pain is worse with deep inspiration.

## 2015-06-17 ENCOUNTER — Emergency Department (HOSPITAL_COMMUNITY): Payer: Self-pay

## 2015-06-17 ENCOUNTER — Encounter (HOSPITAL_COMMUNITY): Payer: Self-pay | Admitting: *Deleted

## 2015-06-17 ENCOUNTER — Emergency Department (HOSPITAL_COMMUNITY)
Admission: EM | Admit: 2015-06-17 | Discharge: 2015-06-18 | Disposition: A | Discharge status: Home / Self Care | Payer: Self-pay | Attending: Emergency Medicine | Admitting: Emergency Medicine

## 2015-06-17 DIAGNOSIS — Z7982 Long term (current) use of aspirin: Secondary | ICD-10-CM | POA: Insufficient documentation

## 2015-06-17 DIAGNOSIS — Z8719 Personal history of other diseases of the digestive system: Secondary | ICD-10-CM | POA: Insufficient documentation

## 2015-06-17 DIAGNOSIS — R4789 Other speech disturbances: Secondary | ICD-10-CM | POA: Insufficient documentation

## 2015-06-17 DIAGNOSIS — R51 Headache: Secondary | ICD-10-CM | POA: Insufficient documentation

## 2015-06-17 DIAGNOSIS — Z792 Long term (current) use of antibiotics: Secondary | ICD-10-CM | POA: Insufficient documentation

## 2015-06-17 DIAGNOSIS — R519 Headache, unspecified: Secondary | ICD-10-CM

## 2015-06-17 DIAGNOSIS — R079 Chest pain, unspecified: Secondary | ICD-10-CM

## 2015-06-17 DIAGNOSIS — Z87448 Personal history of other diseases of urinary system: Secondary | ICD-10-CM | POA: Insufficient documentation

## 2015-06-17 DIAGNOSIS — Z79899 Other long term (current) drug therapy: Secondary | ICD-10-CM | POA: Insufficient documentation

## 2015-06-17 DIAGNOSIS — Z86711 Personal history of pulmonary embolism: Secondary | ICD-10-CM | POA: Insufficient documentation

## 2015-06-17 DIAGNOSIS — Z3202 Encounter for pregnancy test, result negative: Secondary | ICD-10-CM | POA: Insufficient documentation

## 2015-06-17 DIAGNOSIS — J208 Acute bronchitis due to other specified organisms: Secondary | ICD-10-CM | POA: Insufficient documentation

## 2015-06-17 DIAGNOSIS — Z8679 Personal history of other diseases of the circulatory system: Secondary | ICD-10-CM | POA: Insufficient documentation

## 2015-06-17 DIAGNOSIS — Z87442 Personal history of urinary calculi: Secondary | ICD-10-CM | POA: Insufficient documentation

## 2015-06-17 LAB — POC URINE PREG, ED: Preg Test, Ur: NEGATIVE

## 2015-06-17 LAB — CBC WITH DIFFERENTIAL/PLATELET
BASOS PCT: 0 %
Basophils Absolute: 0 10*3/uL (ref 0.0–0.1)
Eosinophils Absolute: 0.1 10*3/uL (ref 0.0–0.7)
Eosinophils Relative: 2 %
HEMATOCRIT: 36.8 % (ref 36.0–46.0)
HEMOGLOBIN: 12 g/dL (ref 12.0–15.0)
LYMPHS ABS: 2 10*3/uL (ref 0.7–4.0)
Lymphocytes Relative: 35 %
MCH: 30.3 pg (ref 26.0–34.0)
MCHC: 32.6 g/dL (ref 30.0–36.0)
MCV: 92.9 fL (ref 78.0–100.0)
MONOS PCT: 8 %
Monocytes Absolute: 0.5 10*3/uL (ref 0.1–1.0)
NEUTROS ABS: 3.2 10*3/uL (ref 1.7–7.7)
NEUTROS PCT: 55 %
Platelets: 170 10*3/uL (ref 150–400)
RBC: 3.96 MIL/uL (ref 3.87–5.11)
RDW: 12.1 % (ref 11.5–15.5)
WBC: 5.8 10*3/uL (ref 4.0–10.5)

## 2015-06-17 LAB — I-STAT TROPONIN, ED
TROPONIN I, POC: 0 ng/mL (ref 0.00–0.08)
Troponin i, poc: 0 ng/mL (ref 0.00–0.08)

## 2015-06-17 LAB — COMPREHENSIVE METABOLIC PANEL
ALBUMIN: 3.8 g/dL (ref 3.5–5.0)
ALK PHOS: 66 U/L (ref 38–126)
ALT: 14 U/L (ref 14–54)
AST: 17 U/L (ref 15–41)
Anion gap: 7 (ref 5–15)
BUN: 7 mg/dL (ref 6–20)
CALCIUM: 8.8 mg/dL — AB (ref 8.9–10.3)
CO2: 23 mmol/L (ref 22–32)
CREATININE: 0.63 mg/dL (ref 0.44–1.00)
Chloride: 112 mmol/L — ABNORMAL HIGH (ref 101–111)
GFR calc Af Amer: >60 mL/min (ref >60)
GFR calc non Af Amer: >60 mL/min (ref >60)
GLUCOSE: 67 mg/dL (ref 65–99)
Potassium: 3.3 mmol/L — ABNORMAL LOW (ref 3.5–5.1)
SODIUM: 142 mmol/L (ref 135–145)
Total Bilirubin: 0.5 mg/dL (ref 0.3–1.2)
Total Protein: 6.2 g/dL — ABNORMAL LOW (ref 6.5–8.1)

## 2015-06-17 LAB — D-DIMER, QUANTITATIVE: D-Dimer, Quant: 2.09 ug/mL-FEU — ABNORMAL HIGH (ref 0.00–0.50)

## 2015-06-17 MED ORDER — SODIUM CHLORIDE 0.9 % IV BOLUS (SEPSIS)
1000.0000 mL | Freq: Once | INTRAVENOUS | Status: AC
  Administered 2015-06-17: 1000 mL via INTRAVENOUS

## 2015-06-17 MED ORDER — PROCHLORPERAZINE EDISYLATE 5 MG/ML IJ SOLN
10.0000 mg | Freq: Once | INTRAMUSCULAR | Status: AC
  Administered 2015-06-17: 10 mg via INTRAVENOUS
  Filled 2015-06-17: qty 2

## 2015-06-17 MED ORDER — IOPAMIDOL (ISOVUE-370) INJECTION 76%
INTRAVENOUS | Status: AC
  Administered 2015-06-17: 75 mL
  Filled 2015-06-17: qty 100

## 2015-06-17 MED ORDER — DIPHENHYDRAMINE HCL 50 MG/ML IJ SOLN
25.0000 mg | Freq: Once | INTRAMUSCULAR | Status: AC
Start: 2015-06-17 — End: 2015-06-17
  Administered 2015-06-17: 25 mg via INTRAVENOUS
  Filled 2015-06-17: qty 1

## 2015-06-17 NOTE — ED Provider Notes (Signed)
CSN: [id number]     Arrival date & time 06/17/15  1826 History   First MD Initiated Contact with Patient 06/17/15 1835     Chief Complaint  Patient presents with  . Chest Pain  . Headache     (Consider location/radiation/quality/duration/timing/severity/associated sxs/prior Treatment) HPI  Came in a few days ago for low blood sugar, low blood pressure. Since being home has developed cough.  [facility], and felt like a "brick" around right head, numbing sensation around head and eyes, felt like about to pass out.  Started at [facility]. Chest felt tightness, felt like "on fire." started drinking a peach soda and peanut butter crackers. Still felt like going to pass out.  Face head/eye feel numb. Nose feels like closing up on me.  Mom is nurse and said pulse was fast.  Flushed and hot feeling.  Feeling stressed and depressed, grandma passed away January, yesterday got her clothes. It's been a very difficult time.  Appetite low. Trying to eat and drink.  Today is son's birthday, Monday was mom's bday when she came here last.  Havent taken phentermine in the last week.   Past Medical History  Diagnosis Date  . Cystitis   . Gall stones   . Migraines   . Kidney stones   . Pulmonary embolism [facility](HCC)    Past Surgical History  Procedure Laterality Date  . Tonsillectomy     History reviewed. No pertinent family history. Social History  Substance Use Topics  . Smoking status: Never Smoker   . Smokeless tobacco: None  . Alcohol Use: No   OB History    No data available     Review of Systems  Constitutional: Positive for appetite change. Negative for fever.  HENT: Negative for sore throat.   Eyes: Negative for visual disturbance.  Respiratory: Positive for cough and shortness of breath.   Cardiovascular: Positive for chest pain.  Gastrointestinal: Negative for abdominal pain.  Genitourinary: Negative for difficulty urinating.  Musculoskeletal:  Negative for back pain and neck pain.  Skin: Negative for rash.  Neurological: Positive for speech difficulty, weakness, light-headedness and headaches. Negative for syncope and facial asymmetry.      Allergies  Levaquin  Home Medications   Prior to Admission medications   Medication Sig Start Date End Date Taking? Authorizing Provider  acetaminophen (TYLENOL) 500 MG tablet Take 1,000 mg by mouth every 6 (six) hours as needed for mild pain or moderate pain.   Yes Historical Provider, MD  aspirin 325 MG EC tablet Take 650 mg by mouth every 6 (six) hours as needed for pain.   Yes Historical Provider, MD  clonazePAM (KLONOPIN) 2 MG tablet Take 2 mg by mouth daily as needed for anxiety.   Yes Historical Provider, MD  HYDROcodone-acetaminophen (NORCO) 10-325 MG tablet Take 1 tablet by mouth every 6 (six) hours as needed for moderate pain.   Yes Historical Provider, MD  phentermine 37.5 MG capsule Take 37.5 mg by mouth daily.   Yes Historical Provider, MD  oxyCODONE-acetaminophen (ROXICET) 5-325 MG tablet Take 1-2 tablets by mouth every 4 (four) hours as needed for severe pain. 05/29/15   Charmayne Sheerharles M Beers, PA-C  sulfamethoxazole-trimethoprim (BACTRIM DS,SEPTRA DS) 800-160 MG tablet Take 1 tablet by mouth 2 (two) times daily. 05/31/15   Jenise V Bacon Menshew, PA-C  traMADol (ULTRAM) 50 MG tablet Take 1 tablet (50 mg total) by mouth 3 (three) times daily as needed. 05/31/15   Smith RobertJenise V  Bacon Menshew, PA-C   BP 91/69 mmHg  Pulse 89  Temp(Src) 97.6 F (36.4 C) (Oral)  Resp 20  SpO2 96%  LMP 06/09/2015 (Exact Date) Physical Exam  Constitutional: She is oriented to person, place, and time. She appears well-developed and well-nourished. No distress.  HENT:  Head: Normocephalic and atraumatic.  Eyes: Conjunctivae and EOM are normal.  Neck: Normal range of motion.  Cardiovascular: Normal rate, regular rhythm, normal heart sounds and intact distal pulses.  Exam reveals no gallop and no friction rub.    No murmur heard. Pulmonary/Chest: Effort normal and breath sounds normal. No respiratory distress. She has no wheezes. She has no rales.  Abdominal: Soft. She exhibits no distension. There is no tenderness. There is no guarding.  Musculoskeletal: She exhibits no edema or tenderness.  Neurological: She is alert and oriented to person, place, and time. She has normal strength. No cranial nerve deficit or sensory deficit. Coordination and gait normal. GCS eye subscore is 4. GCS verbal subscore is 5. GCS motor subscore is 6.  Skin: Skin is warm and dry. No rash noted. She is not diaphoretic. No erythema.  Nursing note and vitals reviewed.   ED Course  Procedures (including critical care time) Labs Review Labs Reviewed  COMPREHENSIVE METABOLIC PANEL - Abnormal; Notable for the following:    Potassium 3.3 (*)    Chloride 112 (*)    Calcium 8.8 (*)    Total Protein 6.2 (*)    All other components within normal limits  D-DIMER, QUANTITATIVE (NOT AT [facility]) - Abnormal; Notable for the following:    D-Dimer, Quant 2.09 (*)    All other components within normal limits  CBC WITH DIFFERENTIAL/PLATELET  Rosezena Sensor, ED  POC URINE PREG, ED  Rosezena Sensor, ED    Imaging Review Dg Chest 2 View  06/17/2015  CLINICAL DATA:  Headache.  Chest pain EXAM: CHEST  2 VIEW COMPARISON:  06/11/2015 FINDINGS: The heart size and mediastinal contours are within normal limits. Both lungs are clear. The visualized skeletal structures are unremarkable. IMPRESSION: No active cardiopulmonary disease. Electronically Signed   By: Marlan Palau M.D.   On: 06/17/2015 19:54   Ct Head Wo Contrast  06/17/2015  CLINICAL DATA:  Acute onset of headaches, and bilateral facial numbness. Initial encounter. EXAM: CT HEAD WITHOUT CONTRAST TECHNIQUE: Contiguous axial images were obtained from the base of the skull through the vertex without intravenous contrast. COMPARISON:  CT of the head performed 05/30/2014 FINDINGS: There  is no evidence of acute infarction, mass lesion, or intra- or extra-axial hemorrhage on CT. The posterior fossa, including the cerebellum, brainstem and fourth ventricle, is within normal limits. The third and lateral ventricles, and basal ganglia are unremarkable in appearance. The cerebral hemispheres are symmetric in appearance, with normal gray-white differentiation. No mass effect or midline shift is seen. There is no evidence of fracture; visualized osseous structures are unremarkable in appearance. There is incomplete fusion of the posterior arch of C1. The visualized portions of the orbits are within normal limits. The paranasal sinuses and mastoid air cells are well-aerated. No significant soft tissue abnormalities are seen. A metallic piercing is noted at the right ear. IMPRESSION: Unremarkable noncontrast CT of the head. Electronically Signed   By: Roanna Raider M.D.   On: 06/17/2015 21:38   Ct Angio Chest Pe W/cm &/or Wo Cm  06/17/2015  CLINICAL DATA:  Chest pain and headache. History of DVT. History of pulmonary embolus 01/02/2011 EXAM: CT ANGIOGRAPHY CHEST WITH CONTRAST  TECHNIQUE: Multidetector CT imaging of the chest was performed using the standard protocol during bolus administration of intravenous contrast. Multiplanar CT image reconstructions and MIPs were obtained to evaluate the vascular anatomy. CONTRAST:  75 cc Isovue 370 IV COMPARISON:  Chest radiograph earlier this day. Chest CT PE protocol 05/30/2014 FINDINGS: There are no filling defects within the pulmonary arteries to suggest pulmonary embolus. No aortic dissection or acute aortic abnormality. The heart is normal in size. No pericardial effusion. No mediastinal or hilar adenopathy. Mild bronchial thickening. No pulmonary edema, focal consolidation, pulmonary mass or suspicious nodule. No pleural effusion. Small hiatal hernia. No acute abnormality in the upper abdomen. Cholelithiasis noted. There are no acute or suspicious osseous  abnormalities. Review of the MIP images confirms the above findings. IMPRESSION: 1. No pulmonary embolus. 2. Bronchial thickening, otherwise no acute intrathoracic process. Electronically Signed   By: Rubye Oaks M.D.   On: 06/17/2015 23:23   I have personally reviewed and evaluated these images and lab results as part of my medical decision-making.   EKG Interpretation   Date/Time:  Sunday Jun 17 2015 18:37:24 EDT Ventricular Rate:  72 PR Interval:  143 QRS Duration: 79 QT Interval:  388 QTC Calculation: 425 R Axis:   63 Text Interpretation:  Sinus rhythm No significant change since last  tracing Confirmed by [facility] MD, Glendell Fouse ([id number]) on 06/17/2015 10:31:53 PM  Also confirmed by [facility] MD, Octavie Westerhold ([id number]), editor [place] CT, Jola Babinski  [phone])  on 06/18/2015 10:49:11 AM      MDM   Final diagnoses:  Chest pain, unspecified chest pain type  Viral bronchitis  Acute nonintractable headache, unspecified headache type   34yo female with history of PE, migraines presents with concern for lightheadedness, chest pain, headache. HA began slowly, doubt SAH, screening head CT obtained and WNL. Reports atypical symptoms of pain with transient feeling of numbness around bilateral eyes. Given multiple symptoms, normal neurologic exam, doubt CVA. Headache improved with compazine/benadryl.  Pt with CP, different from visit days ago. DDimer repeated today and positive. CTA PE study obtained and negative.  Labs otherwise WNL. BP mildly decreased following medications, however pt mentating well, symptoms improved, low suspicion for other acute pathology-no sign PE, no sign dissection, no sign of bacterial infection, no hx overdose. Delta troponin negative. Pt with cough, likely viral bronchitis.  Stress may also contribute to symptoms. Patient discharged in stable condition with understanding of reasons to return.    Alvira Monday, MD 06/18/15 1242

## 2015-06-17 NOTE — ED Notes (Signed)
Provider made aware of low blood pressure.  Second liter of fluid started per order.

## 2015-06-17 NOTE — ED Notes (Addendum)
Pt arrived by [facility] for chest pain and headaches this afternoon, also had changes in vision. Per ems, pt was orthostatic and HR increased to 140. Given saline bolus pta. Pt was seen here recently for hypoglycemia, also recent recent cough since being here. Hx of PE.

## 2016-12-24 ENCOUNTER — Emergency Department
Admission: EM | Admit: 2016-12-24 | Discharge: 2016-12-24 | Disposition: A | Discharge status: Home / Self Care | Payer: Self-pay | Attending: Emergency Medicine | Admitting: Emergency Medicine

## 2016-12-24 ENCOUNTER — Encounter: Payer: Self-pay | Admitting: Emergency Medicine

## 2016-12-24 DIAGNOSIS — Y999 Unspecified external cause status: Secondary | ICD-10-CM | POA: Insufficient documentation

## 2016-12-24 DIAGNOSIS — X58XXXA Exposure to other specified factors, initial encounter: Secondary | ICD-10-CM | POA: Insufficient documentation

## 2016-12-24 DIAGNOSIS — M79605 Pain in left leg: Secondary | ICD-10-CM

## 2016-12-24 DIAGNOSIS — M25562 Pain in left knee: Secondary | ICD-10-CM | POA: Insufficient documentation

## 2016-12-24 DIAGNOSIS — M79604 Pain in right leg: Secondary | ICD-10-CM

## 2016-12-24 DIAGNOSIS — Y9389 Activity, other specified: Secondary | ICD-10-CM | POA: Insufficient documentation

## 2016-12-24 DIAGNOSIS — M25561 Pain in right knee: Secondary | ICD-10-CM | POA: Insufficient documentation

## 2016-12-24 DIAGNOSIS — Z79899 Other long term (current) drug therapy: Secondary | ICD-10-CM | POA: Insufficient documentation

## 2016-12-24 DIAGNOSIS — Y929 Unspecified place or not applicable: Secondary | ICD-10-CM | POA: Insufficient documentation

## 2016-12-24 DIAGNOSIS — S91105A Unspecified open wound of left lesser toe(s) without damage to nail, initial encounter: Secondary | ICD-10-CM | POA: Insufficient documentation

## 2016-12-24 LAB — CBC WITH DIFFERENTIAL/PLATELET
BASOS ABS: 0 10*3/uL (ref 0–0.1)
BASOS PCT: 0 %
EOS ABS: 0.1 10*3/uL (ref 0–0.7)
EOS PCT: 2 %
HCT: 41.5 % (ref 35.0–47.0)
Hemoglobin: 14 g/dL (ref 12.0–16.0)
LYMPHS PCT: 23 %
Lymphs Abs: 1.8 10*3/uL (ref 1.0–3.6)
MCH: 32.1 pg (ref 26.0–34.0)
MCHC: 33.6 g/dL (ref 32.0–36.0)
MCV: 95.3 fL (ref 80.0–100.0)
MONO ABS: 0.5 10*3/uL (ref 0.2–0.9)
Monocytes Relative: 7 %
Neutro Abs: 5.2 10*3/uL (ref 1.4–6.5)
Neutrophils Relative %: 68 %
PLATELETS: 193 10*3/uL (ref 150–440)
RBC: 4.36 MIL/uL (ref 3.80–5.20)
RDW: 13.4 % (ref 11.5–14.5)
WBC: 7.6 10*3/uL (ref 3.6–11.0)

## 2016-12-24 LAB — URINALYSIS, COMPLETE (UACMP) WITH MICROSCOPIC
BACTERIA UA: NONE SEEN
BILIRUBIN URINE: NEGATIVE
GLUCOSE, UA: NEGATIVE mg/dL
HGB URINE DIPSTICK: NEGATIVE
KETONES UR: NEGATIVE mg/dL
LEUKOCYTES UA: NEGATIVE
NITRITE: NEGATIVE
PROTEIN: NEGATIVE mg/dL
RBC / HPF: NONE SEEN RBC/hpf (ref 0–5)
Specific Gravity, Urine: 1.021 (ref 1.005–1.030)
pH: 5 (ref 5.0–8.0)

## 2016-12-24 LAB — COMPREHENSIVE METABOLIC PANEL
ALBUMIN: 4.1 g/dL (ref 3.5–5.0)
ALT: 18 U/L (ref 14–54)
AST: 19 U/L (ref 15–41)
Alkaline Phosphatase: 56 U/L (ref 38–126)
Anion gap: 8 (ref 5–15)
BILIRUBIN TOTAL: 0.7 mg/dL (ref 0.3–1.2)
BUN: 13 mg/dL (ref 6–20)
CHLORIDE: 108 mmol/L (ref 101–111)
CO2: 22 mmol/L (ref 22–32)
Calcium: 9.1 mg/dL (ref 8.9–10.3)
Creatinine, Ser: 0.68 mg/dL (ref 0.44–1.00)
GFR calc Af Amer: >60 mL/min (ref >60)
GFR calc non Af Amer: >60 mL/min (ref >60)
GLUCOSE: 107 mg/dL — AB (ref 65–99)
POTASSIUM: 4.5 mmol/L (ref 3.5–5.1)
Sodium: 138 mmol/L (ref 135–145)
TOTAL PROTEIN: 7 g/dL (ref 6.5–8.1)

## 2016-12-24 LAB — POCT PREGNANCY, URINE: PREG TEST UR: NEGATIVE

## 2016-12-24 LAB — FIBRIN DERIVATIVES D-DIMER (ARMC ONLY): FIBRIN DERIVATIVES D-DIMER (ARMC): 187 ng{FEU}/mL (ref 0.00–499.00)

## 2016-12-24 MED ORDER — CEPHALEXIN 500 MG PO CAPS: 500.0000 mg | ORAL_CAPSULE | Freq: Three times a day (TID) | ORAL | 0 refills | Status: DC

## 2016-12-24 MED ORDER — NAPROXEN 500 MG PO TABS: 500.0000 mg | ORAL_TABLET | Freq: Two times a day (BID) | ORAL | 0 refills | Status: DC

## 2016-12-24 NOTE — ED Provider Notes (Signed)
[facility] Emergency Department Provider Note   ____________________________________________   First MD Initiated Contact with Patient 12/24/16 [phone]     (approximate)  I have reviewed the triage vital signs and the nursing notes.   HISTORY  Chief Complaint Knee Pain   HPI Casey Barry is a 36 y.o. female femoral complaint of bilateral knee pain with numbness and tingling radiating to her feet 1 month. Patient states that she has not had any injury. She has not traveled for any extensive period of time. She denies any previous injury to her lower extremities.She also states that she may have had a spider bite between her fourth and fifth toe on her left foot shows there is a place there. She is concerned about diabetes. She denies any fever or chills. She denies any upper extremity difficulties. She denies any injury to her back. She states that she also does a lot of walking at work and that her legs hurt at night. She does not wear supportive shoes. Currently she does her pain as a 10 over 10.   Past Medical History:  Diagnosis Date  . Cystitis   . Gall stones   . Kidney stones   . Migraines   . Pulmonary embolism (HCC)     There are no active problems to display for this patient.   Past Surgical History:  Procedure Laterality Date  . TONSILLECTOMY      Prior to Admission medications   Medication Sig Start Date End Date Taking? Authorizing Provider  acetaminophen (TYLENOL) 500 MG tablet Take 1,000 mg by mouth every 6 (six) hours as needed for mild pain or moderate pain.    [provider]  aspirin 325 MG EC tablet Take 650 mg by mouth every 6 (six) hours as needed for pain.    [provider]  cephALEXin (KEFLEX) 500 MG capsule Take 1 capsule (500 mg total) by mouth 3 (three) times daily. 12/24/16   Tommi RumpsSummers, Rhonda L, PA-C  clonazePAM (KLONOPIN) 2 MG tablet Take 2 mg by mouth daily as needed for anxiety.    [provider]  HYDROcodone-acetaminophen (NORCO) 10-325 MG tablet Take 1 tablet by mouth every 6 (six) hours as needed for moderate pain.    [provider]  naproxen (NAPROSYN) 500 MG tablet Take 1 tablet (500 mg total) by mouth 2 (two) times daily with a meal. 12/24/16   Tommi RumpsSummers, Rhonda L, PA-C  phentermine 37.5 MG capsule Take 37.5 mg by mouth daily.    [provider]    Allergies Levaquin [levofloxacin]  No family history on file.  Social History Social History   Tobacco Use  . Smoking status: Never Smoker  . Smokeless tobacco: Never Used  Substance Use Topics  . Alcohol use: No  . Drug use: No    Review of Systems Constitutional: No fever/chills Eyes: No visual changes. ENT: No sore throat. Cardiovascular: Denies chest pain. Respiratory: Denies shortness of breath. Gastrointestinal: No abdominal pain.  No nausea, no vomiting.  Musculoskeletal: positive for lower leg pain and numbness. Skin: Negative for rash. No erythema, ecchymosis or abrasions are noted. Neurological: Negative for headaches, focal weakness.   ___________________________________________   PHYSICAL EXAM:  VITAL SIGNS: ED Triage Vitals  Enc Vitals Group     BP 12/24/16 0803 125/80     Pulse Rate 12/24/16 0803 79     Resp 12/24/16 0803 20     Temp 12/24/16 0803 98.4 F (36.9 C)  Temp Source 12/24/16 0803 Oral     SpO2 12/24/16 0803 97 %     Weight 12/24/16 0803 190 lb (86.2 kg)     Height 12/24/16 0803 5\' 1"  (1.549 m)     Head Circumference --      Peak Flow --      Pain Score 12/24/16 0807 10     Pain Loc --      Pain Edu? --      Excl. in GC? --     Constitutional: Alert and oriented. Well appearing and in no acute distress. Eyes: Conjunctivae are normal.  Head: Atraumatic. Neck: No stridor.   Cardiovascular: Normal rate, regular rhythm. Grossly normal heart sounds.  Good peripheral circulation. Respiratory: Normal respiratory effort.  No retractions. Lungs  CTAB. Gastrointestinal: Soft and nontender. No distention.  No CVA tenderness. Musculoskeletal: moves upper and lower extremities than difficulty. On examination of the lower extremities there is no gross deformity and no soft tissue swelling present. There is no point tenderness on palpation. Range of motion is within normal limits and patient is able to bear weight and has a normal gait without assistance. There is no soft tissue tenderness or warmth present. Homans sign is negative. Good muscle strength lower extremities bilaterally. Neurologic:  Normal speech and language. No gross focal neurologic deficits are appreciated. No gait instability. Skin:  Skin is warm, dry. There is a shallowpressure ulcer noted between the fourth and fifth digits left foot which appears on the fourth toe. There is no drainage and no erythema noted. There is minimal soft tissue swelling present. Capillary refill is within normal limits. Psychiatric: Mood and affect are normal. Speech and behavior are normal.  ____________________________________________   LABS (all labs ordered are listed, but only abnormal results are displayed)  Labs Reviewed  URINALYSIS, COMPLETE (UACMP) WITH MICROSCOPIC - Abnormal; Notable for the following components:      Result Value   Color, Urine YELLOW (*)    APPearance CLEAR (*)    Squamous Epithelial / LPF 0-5 (*)    All other components within normal limits  COMPREHENSIVE METABOLIC PANEL - Abnormal; Notable for the following components:   Glucose, Bld 107 (*)    All other components within normal limits  CBC WITH DIFFERENTIAL/PLATELET  FIBRIN DERIVATIVES D-DIMER ([facility] ONLY)  POC URINE PREG, ED  POCT PREGNANCY, URINE    PROCEDURES  Procedure(s) performed: None  Procedures  Critical Care performed: No  ____________________________________________   INITIAL IMPRESSION / ASSESSMENT AND PLAN / ED COURSE  All labs were discussed with patient and were reassuring.  Patient was discharged with naproxen 500 mg twice a day with food and cephalexin 500 mg 3 times a day for 7 days. She is also instructed to use a cotton ball between her fourth and fifth toe to relieve some of the pressure and keep this area dry. We discussed supportive shoes and also compression socks when she is working. 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 ____________________________________________   FINAL CLINICAL IMPRESSION(S) / ED DIAGNOSES  Final diagnoses:  Pain in both lower extremities  Open wound of fourth toe of left foot, initial encounter     ED Discharge Orders        Ordered    naproxen (NAPROSYN) 500 MG tablet  2 times daily with meals     12/24/16 1032    cephALEXin (KEFLEX) 500 MG capsule  3 times daily  12/24/16 1032       Note:  This document was prepared using Dragon voice recognition software and may include unintentional dictation errors.    Tommi Rumps, PA-C 12/24/16 1457    Sharman Cheek, MD 12/24/16 1520

## 2016-12-24 NOTE — ED Notes (Signed)
Pt c/o wound between 4th and 5th toe that is draining and painful, pt also c/o bilateral lower leg swelling. Pt also c/o bilateral lower leg tingling and numbness that starts at her knee and goes shoots down to her feet. Pt states family hx of diabetes at this time. Pt states that she would like to be checked for diabetes.

## 2016-12-24 NOTE — ED Triage Notes (Addendum)
PT to ED via POV with c/o bilat knee pain with numbness and tingling radiating to feet [id number]. PT states she may have had spider bite. Denies any fever or n/v

## 2016-12-24 NOTE — Discharge Instructions (Signed)
Follow-up with your doctor or the clinics listed on your discharge papers. Begin taking Keflex 500 mg 3 times a day for 7 days. Naproxen 500 mg twice a day with food. Obtain supportive shoes and compression socks. Decrease the amount of salt your eating per day.  Elevate legs as needed for swelling. Also place cotton ball between your fourth and fifth toe.

## 2017-03-10 ENCOUNTER — Other Ambulatory Visit: Payer: Self-pay | Admitting: Obstetrics and Gynecology

## 2017-03-10 DIAGNOSIS — Z3046 Encounter for surveillance of implantable subdermal contraceptive: Secondary | ICD-10-CM

## 2017-03-11 ENCOUNTER — Ambulatory Visit (HOSPITAL_COMMUNITY): Payer: Self-pay

## 2017-03-23 ENCOUNTER — Encounter (HOSPITAL_COMMUNITY): Payer: Self-pay

## 2017-03-23 ENCOUNTER — Ambulatory Visit (HOSPITAL_COMMUNITY): Payer: Medicaid Other

## 2017-03-23 ENCOUNTER — Ambulatory Visit: Payer: Self-pay | Admitting: Family Medicine

## 2017-03-24 ENCOUNTER — Telehealth: Payer: Self-pay | Admitting: *Deleted

## 2017-03-24 ENCOUNTER — Other Ambulatory Visit: Payer: Self-pay | Admitting: *Deleted

## 2017-03-24 NOTE — Telephone Encounter (Signed)
Casey Barry has been scheduled for an u/s of the soft tissue of her left upper arm on 4/3 @ 1330 [facility]. She needs prior to her nexplanon removal on 4/5 in this office. I called and left a message on her voice mail with appt details, asked her to call if she has any questions.

## 2017-04-22 ENCOUNTER — Ambulatory Visit (HOSPITAL_COMMUNITY)
Admission: RE | Admit: 2017-04-22 | Discharge: 2017-04-22 | Disposition: A | Discharge status: Home / Self Care | Payer: Medicaid Other | Source: Ambulatory Visit | Attending: Obstetrics and Gynecology | Admitting: Obstetrics and Gynecology

## 2017-04-22 DIAGNOSIS — Z3046 Encounter for surveillance of implantable subdermal contraceptive: Secondary | ICD-10-CM | POA: Insufficient documentation

## 2017-04-24 ENCOUNTER — Ambulatory Visit: Payer: Self-pay | Admitting: Obstetrics & Gynecology

## 2017-04-27 ENCOUNTER — Emergency Department: Payer: Self-pay

## 2017-04-27 ENCOUNTER — Emergency Department
Admission: EM | Admit: 2017-04-27 | Discharge: 2017-04-28 | Disposition: A | Discharge status: Home / Self Care | Payer: Self-pay | Attending: Emergency Medicine | Admitting: Emergency Medicine

## 2017-04-27 ENCOUNTER — Other Ambulatory Visit: Payer: Self-pay

## 2017-04-27 ENCOUNTER — Encounter: Payer: Self-pay | Admitting: Emergency Medicine

## 2017-04-27 ENCOUNTER — Other Ambulatory Visit: Payer: Self-pay | Admitting: Obstetrics & Gynecology

## 2017-04-27 DIAGNOSIS — I8002 Phlebitis and thrombophlebitis of superficial vessels of left lower extremity: Secondary | ICD-10-CM

## 2017-04-27 DIAGNOSIS — R079 Chest pain, unspecified: Secondary | ICD-10-CM | POA: Insufficient documentation

## 2017-04-27 DIAGNOSIS — Z79899 Other long term (current) drug therapy: Secondary | ICD-10-CM | POA: Insufficient documentation

## 2017-04-27 DIAGNOSIS — Z975 Presence of (intrauterine) contraceptive device: Secondary | ICD-10-CM

## 2017-04-27 DIAGNOSIS — I8001 Phlebitis and thrombophlebitis of superficial vessels of right lower extremity: Secondary | ICD-10-CM | POA: Insufficient documentation

## 2017-04-27 LAB — BASIC METABOLIC PANEL
Anion gap: 7 (ref 5–15)
BUN: 14 mg/dL (ref 6–20)
CALCIUM: 9.3 mg/dL (ref 8.9–10.3)
CO2: 24 mmol/L (ref 22–32)
CREATININE: 0.71 mg/dL (ref 0.44–1.00)
Chloride: 107 mmol/L (ref 101–111)
GFR calc Af Amer: >60 mL/min (ref >60)
Glucose, Bld: 101 mg/dL — ABNORMAL HIGH (ref 65–99)
Potassium: 3.8 mmol/L (ref 3.5–5.1)
SODIUM: 138 mmol/L (ref 135–145)

## 2017-04-27 LAB — CBC
HCT: 42.6 % (ref 35.0–47.0)
Hemoglobin: 14.2 g/dL (ref 12.0–16.0)
MCH: 32 pg (ref 26.0–34.0)
MCHC: 33.4 g/dL (ref 32.0–36.0)
MCV: 96 fL (ref 80.0–100.0)
PLATELETS: 229 10*3/uL (ref 150–440)
RBC: 4.43 MIL/uL (ref 3.80–5.20)
RDW: 13.2 % (ref 11.5–14.5)
WBC: 9.8 10*3/uL (ref 3.6–11.0)

## 2017-04-27 LAB — POCT PREGNANCY, URINE: Preg Test, Ur: NEGATIVE

## 2017-04-27 LAB — TROPONIN I

## 2017-04-27 MED ORDER — IOPAMIDOL (ISOVUE-370) INJECTION 76%
75.0000 mL | Freq: Once | INTRAVENOUS | Status: AC | PRN
  Administered 2017-04-27: 75 mL via INTRAVENOUS

## 2017-04-27 MED ORDER — ACETAMINOPHEN 500 MG PO TABS
1000.0000 mg | ORAL_TABLET | Freq: Once | ORAL | Status: AC
  Administered 2017-04-27: 1000 mg via ORAL
  Filled 2017-04-27: qty 2

## 2017-04-27 NOTE — ED Notes (Signed)
Pt still off the floor for testing. 

## 2017-04-27 NOTE — ED Triage Notes (Signed)
Pt states L chest pain that started this weekend. Pt states L foot pain for months. Pt states recently she went to have implanted birth control checked for placement ([place]) is no longer in her arm and is supposed to have an MRI is schedule for this week. Pt has hx of blood clots. Pt states seen for L foot pain in the past. Pt c/o numbness in 3rd through 5th digit.

## 2017-04-27 NOTE — ED Notes (Signed)
Pt to the er for multiple medical complaints. Pt states that she had an implanan implanted in the left arm and when she went to have it removed, it was gone. Pt said her MD had her go to [place] to do a chest xray to find where the implant had migrated to. Pt has midsternal chest pain. Pt did have a cough but not now. Pt says that her left foot also hurts and she has been having problems for months but it has gotten worse over the last few weeks. Pt c/o numbness to the 3rd, 4th and 5th digits of the left foot. Pt says she has pain to the heel of her foot. Pt unable to state anything that makes it better or worse. Pt is in no visible distress. Pt has a hx of PE and a hx of a pressure ulcer between her toes.

## 2017-04-27 NOTE — ED Notes (Signed)
ED Provider at bedside. 

## 2017-04-27 NOTE — ED Triage Notes (Signed)
Here for left side chest pain that started over the weekend. Unlabored. Color WNL.  nexplonon no longer in arm per pt.  Has had IUD move in past.  Blood clot from nuvaring.

## 2017-04-27 NOTE — ED Triage Notes (Signed)
FIRST NURSE NOTE -here for chest pain.  Reports supposed to have imaging next week because her nexplenon has moved they think into chest.  Ambulatory. NAD

## 2017-04-27 NOTE — ED Notes (Signed)
Pt given a pair of socks per request.

## 2017-04-27 NOTE — ED Provider Notes (Signed)
[facility] Emergency Department Provider Note  ____________________________________________   First MD Initiated Contact with Patient 04/27/17 2048     (approximate)  I have reviewed the triage vital signs and the nursing notes.   HISTORY  Chief Complaint Chest Pain and Foot Pain   HPI Casey Barry is a 37 y.o. female who self presents the emergency department with multiple issues.  She reports several weeks of slow onset gradually progressive now moderate to severe throbbing numbness in her lateral left foot swelling in her left lower extremity.  She is concerned because she has a previous history of deep vein thrombosis and pulmonary embolism and is no longer taking anticoagulation.  Her previous clots were felt to be secondary to birth control and she is not supposed to be anticoagulated.  She is also reporting some sharp upper chest pain worse with deep inspiration and mild shortness of breath.  She is also concerned because she had Nexplanon placed in her left arm and apparently her OB gynecologist can "not find it" and is scheduled for a "MRI" later on this week to search for the missing implant.  Past Medical History:  Diagnosis Date  . Cystitis   . Gall stones   . Kidney stones   . Migraines   . Pulmonary embolism (HCC)     There are no active problems to display for this patient.   Past Surgical History:  Procedure Laterality Date  . TONSILLECTOMY      Prior to Admission medications   Medication Sig Start Date End Date Taking? Authorizing Provider  acetaminophen (TYLENOL) 500 MG tablet Take 1,000 mg by mouth every 6 (six) hours as needed for mild pain or moderate pain.    [provider]  aspirin 325 MG EC tablet Take 650 mg by mouth every 6 (six) hours as needed for pain.    [provider]  cephALEXin (KEFLEX) 500 MG capsule Take 1 capsule (500 mg total) by mouth 3 (three) times daily. 12/24/16   Tommi Rumps, PA-C    clonazePAM (KLONOPIN) 2 MG tablet Take 2 mg by mouth daily as needed for anxiety.    [provider]  HYDROcodone-acetaminophen (NORCO) 10-325 MG tablet Take 1 tablet by mouth every 6 (six) hours as needed for moderate pain.    [provider]  ibuprofen (ADVIL,MOTRIN) 600 MG tablet Take 1 tablet (600 mg total) by mouth every 8 (eight) hours as needed. 04/28/17   Merrily Brittle, MD  naproxen (NAPROSYN) 500 MG tablet Take 1 tablet (500 mg total) by mouth 2 (two) times daily with a meal. 12/24/16   Tommi Rumps, PA-C  phentermine 37.5 MG capsule Take 37.5 mg by mouth daily.    [provider]    Allergies Levaquin [levofloxacin]  History reviewed. No pertinent family history.  Social History Social History   Tobacco Use  . Smoking status: Never Smoker  . Smokeless tobacco: Never Used  Substance Use Topics  . Alcohol use: No  . Drug use: No    Review of Systems Constitutional: No fever/chills Eyes: No visual changes. ENT: No sore throat. Cardiovascular: Positive for chest pain. Respiratory: Positive for shortness of breath. Gastrointestinal: No abdominal pain.  No nausea, no vomiting.  No diarrhea.  No constipation. Genitourinary: Negative for dysuria. Musculoskeletal: Negative for back pain. Skin: Negative for rash. Neurological: Negative for headaches, focal weakness or numbness.   ____________________________________________   PHYSICAL EXAM:  VITAL SIGNS: ED Triage Vitals  Enc  Vitals Group     BP 04/27/17 1659 115/62     Pulse Rate 04/27/17 1659 82     Resp 04/27/17 1659 20     Temp 04/27/17 1659 99 F (37.2 C)     Temp Source 04/27/17 1659 Oral     SpO2 04/27/17 1659 98 %     Weight 04/27/17 1634 190 lb (86.2 kg)     Height 04/27/17 1634 5\' 3"  (1.6 m)     Head Circumference --      Peak Flow --      Pain Score 04/27/17 1633 10     Pain Loc --      Pain Edu? --      Excl. in GC? --     Constitutional: Alert and oriented x4  quite anxious appearing nontoxic no diaphoresis speaks full clear sentences Eyes: PERRL EOMI. Head: Atraumatic. Nose: No congestion/rhinnorhea. Mouth/Throat: No trismus Neck: No stridor.   Cardiovascular: Normal rate, regular rhythm. Grossly normal heart sounds.  Good peripheral circulation. Respiratory: Normal respiratory effort.  No retractions. Lungs CTAB and moving good air Gastrointestinal: Soft nontender Musculoskeletal: Trace edema to left lower extremity Neurologic:  Normal speech and language. No gross focal neurologic deficits are appreciated. Skin:  Skin is warm, dry and intact. No rash noted. Psychiatric: Anxious appearing    ____________________________________________   DIFFERENTIAL includes but not limited to  DVT, pulmonary embolism, thrombophlebitis ____________________________________________   LABS (all labs ordered are listed, but only abnormal results are displayed)  Labs Reviewed  BASIC METABOLIC PANEL - Abnormal; Notable for the following components:      Result Value   Glucose, Bld 101 (*)    All other components within normal limits  CBC  TROPONIN I  POCT PREGNANCY, URINE  POC URINE PREG, ED    Lab work reviewed by me with no acute disease __________________________________________  EKG    ____________________________________________  RADIOLOGY  CT angiogram reviewed by me with no acute disease lower extremity ultrasound reviewed by me consistent with distal thrombophlebitis ____________________________________________   PROCEDURES  Procedure(s) performed: no  Procedures  Critical Care performed: no  Observation: no ____________________________________________   INITIAL IMPRESSION / ASSESSMENT AND PLAN / ED COURSE  Pertinent labs & imaging results that were available during my care of the patient were reviewed by me and considered in my medical decision making (see chart for details).  The patient arrives with chest pain  shortness of breath and a known history of previous DVT.  She also has unilateral leg pain and swelling.  Ultrasound and CT scan are pending.  Patient's pain is improved after Tylenol.  Fortunately the CT scan is negative for pulmonary embolism.  Her left lower extremity ultrasound is consistent with superficial phlebitis.  I discussed nonsteroidals and topical heat with the patient and the importance of primary care follow-up.  She verbalizes understanding and agreement with the plan.      ____________________________________________   FINAL CLINICAL IMPRESSION(S) / ED DIAGNOSES  Final diagnoses:  Thrombophlebitis of superficial veins of left lower extremity      NEW MEDICATIONS STARTED DURING THIS VISIT:  New Prescriptions   IBUPROFEN (ADVIL,MOTRIN) 600 MG TABLET    Take 1 tablet (600 mg total) by mouth every 8 (eight) hours as needed.     Note:  This document was prepared using Dragon voice recognition software and may include unintentional dictation errors.     Merrily Brittleifenbark, Oni Dietzman, MD 04/28/17 [phone]

## 2017-04-28 MED ORDER — IBUPROFEN 600 MG PO TABS: 600.0000 mg | ORAL_TABLET | Freq: Three times a day (TID) | ORAL | 0 refills | Status: DC | PRN

## 2017-04-28 NOTE — Discharge Instructions (Signed)
Fortunately today your blood work, your CT scan, and your ultrasound were reassuring.  Please use a heating pad on your left ankle and take ibuprofen 3 times a day to help with your pain and swelling.  Follow-up with your primary care physician within the next 2 weeks for reevaluation.  Return to the emergency department sooner for any concerns.  It was a pleasure to take care of you today, and thank you for coming to our emergency department.  If you have any questions or concerns before leaving please ask the nurse to grab me and I'm more than happy to go through your aftercare instructions again.  If you were prescribed any opioid pain medication today such as Norco, Vicodin, Percocet, morphine, hydrocodone, or oxycodone please make sure you do not drive when you are taking this medication as it can alter your ability to drive safely.  If you have any concerns once you are home that you are not improving or are in fact getting worse before you can make it to your follow-up appointment, please do not hesitate to call 911 and come back for further evaluation.  Merrily BrittleNeil Simisola Sandles, MD  Results for orders placed or performed during the hospital encounter of 04/27/17  Basic metabolic panel  Result Value Ref Range   Sodium 138 135 - 145 mmol/L   Potassium 3.8 3.5 - 5.1 mmol/L   Chloride 107 101 - 111 mmol/L   CO2 24 22 - 32 mmol/L   Glucose, Bld 101 (H) 65 - 99 mg/dL   BUN 14 6 - 20 mg/dL   Creatinine, Ser 9.620.71 0.44 - 1.00 mg/dL   Calcium 9.3 8.9 - 95.210.3 mg/dL   GFR calc non Af Amer >60 >60 mL/min   GFR calc Af Amer >60 >60 mL/min   Anion gap 7 5 - 15  CBC  Result Value Ref Range   WBC 9.8 3.6 - 11.0 K/uL   RBC 4.43 3.80 - 5.20 MIL/uL   Hemoglobin 14.2 12.0 - 16.0 g/dL   HCT 84.142.6 32.435.0 - 40.147.0 %   MCV 96.0 80.0 - 100.0 fL   MCH 32.0 26.0 - 34.0 pg   MCHC 33.4 32.0 - 36.0 g/dL   RDW 02.713.2 25.311.5 - 66.414.5 %   Platelets 229 150 - 440 K/uL  Troponin I  Result Value Ref Range   Troponin I <0.03 <0.03  ng/mL  Pregnancy, urine POC  Result Value Ref Range   Preg Test, Ur NEGATIVE NEGATIVE   Dg Chest 2 View  Result Date: 04/27/2017 CLINICAL DATA:  Left-sided chest pain for several days. EXAM: CHEST - 2 VIEW COMPARISON:  06/17/2015 FINDINGS: The heart size and mediastinal contours are within normal limits. Both lungs are clear. The visualized skeletal structures are unremarkable. IMPRESSION: No active cardiopulmonary disease. Electronically Signed   By: Myles RosenthalJohn  Stahl M.D.   On: 04/27/2017 17:24   Ct Angio Chest Pe W/cm &/or Wo Cm  Result Date: 04/27/2017 CLINICAL DATA:  Left-sided chest pain EXAM: CT ANGIOGRAPHY CHEST WITH CONTRAST TECHNIQUE: Multidetector CT imaging of the chest was performed using the standard protocol during bolus administration of intravenous contrast. Multiplanar CT image reconstructions and MIPs were obtained to evaluate the vascular anatomy. CONTRAST:  75mL ISOVUE-370 IOPAMIDOL (ISOVUE-370) INJECTION 76% COMPARISON:  04/27/2017, CT chest 06/17/2015 FINDINGS: Cardiovascular: Satisfactory opacification of the pulmonary arteries to the segmental level. No evidence of pulmonary embolism. Normal heart size. No pericardial effusion. Nonaneurysmal aorta. No dissection seen. Mediastinum/Nodes: No enlarged mediastinal, hilar, or axillary lymph  nodes. Thyroid gland, trachea, and esophagus demonstrate no significant findings. Lungs/Pleura: Lungs are clear. No pleural effusion or pneumothorax. Upper Abdomen: No acute abnormality. Small esophageal hiatal hernia. Partially visualized calcified stones in the gallbladder. Musculoskeletal: Bilateral breast implants. No acute or suspicious abnormality. Review of the MIP images confirms the above findings. IMPRESSION: 1. Negative for acute pulmonary embolus or aortic dissection 2. Clear lung fields 3. Gallstones Electronically Signed   By: Jasmine Pang M.D.   On: 04/27/2017 22:37   US Venous Img Lower  Left (dvt Study)  Result Date: 04/27/2017 CLINICAL  DATA:  Left leg edema and pain for 2 months EXAM: Left LOWER EXTREMITY VENOUS DOPPLER ULTRASOUND TECHNIQUE: Gray-scale sonography with graded compression, as well as color Doppler and duplex ultrasound were performed to evaluate the lower extremity deep venous systems from the level of the common femoral vein and including the common femoral, femoral, profunda femoral, popliteal and calf veins including the posterior tibial, peroneal and gastrocnemius veins when visible. The superficial great saphenous vein was also interrogated. Spectral Doppler was utilized to evaluate flow at rest and with distal augmentation maneuvers in the common femoral, femoral and popliteal veins. COMPARISON:  None. FINDINGS: Contralateral Common Femoral Vein: Respiratory phasicity is normal and symmetric with the symptomatic side. No evidence of thrombus. Normal compressibility. Common Femoral Vein: No evidence of thrombus. Normal compressibility, respiratory phasicity and response to augmentation. Saphenofemoral Junction: No evidence of thrombus. Normal compressibility and flow on color Doppler imaging. Profunda Femoral Vein: No evidence of thrombus. Normal compressibility and flow on color Doppler imaging. Femoral Vein: No evidence of thrombus. Normal compressibility, respiratory phasicity and response to augmentation. Popliteal Vein: No evidence of thrombus. Normal compressibility, respiratory phasicity and response to augmentation. Calf Veins: No evidence of thrombus. Normal compressibility and flow on color Doppler imaging. Other Findings: Short segment occlusive thrombus within a pair of veins at the left medial ankle. IMPRESSION: No evidence of deep venous thrombosis. Short segment occlusive thrombus within a pair of veins at or below the level of the ankle. Electronically Signed   By: Jasmine Pang M.D.   On: 04/27/2017 23:50   [place] Lt Upper Extrem Ltd Soft Tissue Non Vascular  Result Date: 04/22/2017 CLINICAL DATA:  Surveillance  of Nexplanon EXAM: ULTRASOUND LEFT UPPER EXTREMITY LIMITED TECHNIQUE: Ultrasound examination of the upper extremity soft tissues was performed in the area of clinical concern around the scar from previous insertion of the device. COMPARISON:  None available FINDINGS: No evidence of foreign body corresponding to Nexplanon device. IMPRESSION: Non-visualization of the device. Radiography may be useful for further localization. Electronically Signed   By: Corlis Leak M.D.   On: 04/22/2017 16:03

## 2017-04-30 ENCOUNTER — Ambulatory Visit (HOSPITAL_COMMUNITY): Admission: RE | Admit: 2017-04-30 | Payer: Self-pay | Source: Ambulatory Visit

## 2017-04-30 ENCOUNTER — Encounter (HOSPITAL_COMMUNITY): Payer: Self-pay

## 2017-04-30 ENCOUNTER — Ambulatory Visit (HOSPITAL_COMMUNITY)
Admission: RE | Admit: 2017-04-30 | Discharge: 2017-04-30 | Disposition: A | Discharge status: Home / Self Care | Payer: Self-pay | Source: Ambulatory Visit | Attending: Obstetrics & Gynecology | Admitting: Obstetrics & Gynecology

## 2017-04-30 ENCOUNTER — Other Ambulatory Visit: Payer: Self-pay | Admitting: Obstetrics & Gynecology

## 2017-04-30 DIAGNOSIS — Z975 Presence of (intrauterine) contraceptive device: Secondary | ICD-10-CM

## 2017-06-03 ENCOUNTER — Encounter: Payer: Medicaid Other | Admitting: Family Medicine

## 2017-06-08 ENCOUNTER — Ambulatory Visit (INDEPENDENT_AMBULATORY_CARE_PROVIDER_SITE_OTHER): Payer: Medicaid Other | Admitting: Obstetrics and Gynecology

## 2017-06-08 ENCOUNTER — Encounter: Payer: Self-pay | Admitting: Obstetrics and Gynecology

## 2017-06-08 ENCOUNTER — Encounter: Payer: Self-pay | Admitting: Radiology

## 2017-06-08 VITALS — BP 121/79 | HR 66 | Ht 61.0 in | Wt 194.0 lb

## 2017-06-08 DIAGNOSIS — Z3202 Encounter for pregnancy test, result negative: Secondary | ICD-10-CM

## 2017-06-08 DIAGNOSIS — Z30017 Encounter for initial prescription of implantable subdermal contraceptive: Secondary | ICD-10-CM

## 2017-06-08 DIAGNOSIS — Z3046 Encounter for surveillance of implantable subdermal contraceptive: Secondary | ICD-10-CM

## 2017-06-08 LAB — POCT URINE PREGNANCY: PREG TEST UR: NEGATIVE

## 2017-06-08 NOTE — Progress Notes (Signed)
Nexplanon Removal (Attempted) Procedure Note Patient has her 2nd nexplanon in place and it's 3 year expiration was due in February 2019. Patient states she had it placed at the [facility] and had attempted removal there earlier this year but they were unsuccessful. She had an u/s and they didn't find it but it was seen on an x-ray.   Prior to the procedure being performed, the patient was asked to state their full name, date of birth, type of procedure being performed and the exact location of the operative site. This information was then checked against the documentation in the patient's chart. Prior to the procedure being performed, a "time out" was performed by the physician that confirmed the correct patient, procedure and site.  After informed consent was obtained, the patient's left arm was inspected.  The healed over insertion site was inspected and the distal tip of what felt like the nexplanon was palpated somewhat deep below the skin at the insertion, after milking the tissue approximately 5cm proximal to that bring the nexplanon closer the skin.  I tried to inject the 3mL lidocaine with epi underneath the nexplanon, after cleaning with alcohol. Betadine was used to clean the area and then sterile gloves up on and 11 blade was used to open up the prior incision, but I was unable to palpate it after this or bring it up to the incision with milking the tissue proximal to this.  Pressure was applied and the insertion site was hemostatic and a pressure dressing was applied.   The patient tolerated the procedure well.   I told her that I'll refer her to [facility] and I feel that it can be removed but w/o injection of local that will distort the area around the old insertion site; so maybe some type of regional block can be used. I told her it's not imperative that it gets removed asap b/c many studies indicate that it is likely good up to 4 years and possibly even 5.   She does desire another  nexplanon.  Cornelia Copa MD Attending Center for [employer] [facility])

## 2017-06-08 NOTE — Progress Notes (Signed)
Desires Nexplanon removal and insert

## 2017-06-09 ENCOUNTER — Telehealth: Payer: Self-pay | Admitting: Radiology

## 2017-06-09 NOTE — Telephone Encounter (Signed)
Called and spoke with [facility], Maralyn Sago explained that [facility] would not cover this procedure, and that the procedure will be self pay. Called patient, explained what [facility] said, she asked what the price would be from surgery self-pay, I explained that she can contact them to find out this information and schedule appointment at this time, if this was something that she felt comfortable with. Pt will call [facility] if there is something else that she would like to follow up with.

## 2018-07-04 ENCOUNTER — Emergency Department: Payer: Medicaid Other

## 2018-07-04 ENCOUNTER — Other Ambulatory Visit: Payer: Self-pay

## 2018-07-04 DIAGNOSIS — Z5321 Procedure and treatment not carried out due to patient leaving prior to being seen by health care provider: Secondary | ICD-10-CM | POA: Insufficient documentation

## 2018-07-04 DIAGNOSIS — R079 Chest pain, unspecified: Secondary | ICD-10-CM | POA: Insufficient documentation

## 2018-07-04 LAB — COMPREHENSIVE METABOLIC PANEL
ALT: 32 U/L (ref 0–44)
AST: 25 U/L (ref 15–41)
Albumin: 4.3 g/dL (ref 3.5–5.0)
Alkaline Phosphatase: 67 U/L (ref 38–126)
Anion gap: 8 (ref 5–15)
BUN: 18 mg/dL (ref 6–20)
CO2: 25 mmol/L (ref 22–32)
Calcium: 9.1 mg/dL (ref 8.9–10.3)
Chloride: 108 mmol/L (ref 98–111)
Creatinine, Ser: 0.7 mg/dL (ref 0.44–1.00)
GFR calc Af Amer: >60 mL/min (ref >60)
GFR calc non Af Amer: >60 mL/min (ref >60)
Glucose, Bld: 112 mg/dL — ABNORMAL HIGH (ref 70–99)
Potassium: 3.8 mmol/L (ref 3.5–5.1)
Sodium: 141 mmol/L (ref 135–145)
Total Bilirubin: 1.1 mg/dL (ref 0.3–1.2)
Total Protein: 7.2 g/dL (ref 6.5–8.1)

## 2018-07-04 LAB — CBC
HCT: 39.2 % (ref 36.0–46.0)
Hemoglobin: 13.2 g/dL (ref 12.0–15.0)
MCH: 31.9 pg (ref 26.0–34.0)
MCHC: 33.7 g/dL (ref 30.0–36.0)
MCV: 94.7 fL (ref 80.0–100.0)
Platelets: 167 10*3/uL (ref 150–400)
RBC: 4.14 MIL/uL (ref 3.87–5.11)
RDW: 12.3 % (ref 11.5–15.5)
WBC: 9.5 10*3/uL (ref 4.0–10.5)
nRBC: 0 % (ref 0.0–0.2)

## 2018-07-04 LAB — TROPONIN I: Troponin I: <0.03 ng/mL (ref <0.03)

## 2018-07-04 NOTE — ED Triage Notes (Signed)
Pt states she has also had foot swelling.

## 2018-07-04 NOTE — ED Triage Notes (Signed)
Pt with chest pain that began about one hour ago. Pt states left arm and shoulder pain. Pt states she does have left neck pain, pt complains of nausea. Pt denies shob.

## 2018-07-05 ENCOUNTER — Telehealth: Payer: Self-pay | Admitting: Emergency Medicine

## 2018-07-05 ENCOUNTER — Emergency Department
Admission: EM | Admit: 2018-07-05 | Discharge: 2018-07-05 | Disposition: A | Discharge status: Home / Self Care | Payer: Medicaid Other | Attending: Emergency Medicine | Admitting: Emergency Medicine

## 2018-07-05 NOTE — Telephone Encounter (Signed)
Called patient due to lwot to inquire about condition and follow up plans.  No answer and the mailbox is full.  

## 2018-10-01 ENCOUNTER — Telehealth: Payer: Self-pay

## 2018-10-01 NOTE — Telephone Encounter (Signed)
Called Ms. [place] for pre-visit screening for upcoming [place] clinic appointment on 10/05/2018. No answer; voicemail is full; unable to leave message.

## 2018-10-05 ENCOUNTER — Ambulatory Visit
Admission: RE | Admit: 2018-10-05 | Discharge: 2018-10-05 | Disposition: A | Discharge status: Home / Self Care | Payer: Medicaid Other | Source: Ambulatory Visit | Attending: Oncology | Admitting: Oncology

## 2018-10-05 ENCOUNTER — Other Ambulatory Visit: Payer: Self-pay

## 2018-10-05 ENCOUNTER — Encounter: Payer: Self-pay | Admitting: *Deleted

## 2018-10-05 ENCOUNTER — Ambulatory Visit: Payer: Medicaid Other | Attending: Oncology | Admitting: *Deleted

## 2018-10-05 ENCOUNTER — Other Ambulatory Visit: Payer: Self-pay | Admitting: *Deleted

## 2018-10-05 VITALS — BP 142/87 | HR 79 | Temp 97.2°F | Ht 61.0 in | Wt 198.0 lb

## 2018-10-05 DIAGNOSIS — N6452 Nipple discharge: Secondary | ICD-10-CM

## 2018-10-05 NOTE — Progress Notes (Signed)
  Subjective:     Patient ID: Casey Barry, female   DOB: [birth year], 38 y.o.   MRN: [id number]  HPI   Review of Systems     Objective:   Physical Exam Chest:     Breasts: Breasts are symmetrical.        Right: Nipple discharge present. No swelling, bleeding, inverted nipple, mass, skin change or tenderness.        Left: Nipple discharge present. No swelling, bleeding, inverted nipple, mass, skin change or tenderness.     Comments: Bilateral breast with green discharge from multiple ducts on expression only Lymphadenopathy:     Upper Body:     Right upper body: No supraclavicular or axillary adenopathy.     Left upper body: No supraclavicular or axillary adenopathy.        Assessment:     38 year old White female self referral for a right breast mass.  Patient states she felt a mass while lying in the tub about 3 months ago.  States she was seen at [facility] during a follow up and was told they could also feel the mass.  Patient has bilateral breast implants and states the area is hard to feel.  She also states she has had bilateral "green" nipple discharge for about 2 years on expression only.  On clinical breast exam there is no dominant mass, skin changes or lymphadenopathy.  The patient is unable to find the mass in the right breast that was of concern to her, even while lying in the same position.  The patient can express a green discharge from multiple ducts bilaterally.  Taught self breast awareness.  Family history of breast cancer in a maternal great grandmother.  Patient has been screened for eligibility.  She does not have any insurance, Medicare or Medicaid.  She also meets financial eligibility.  Hand-out given on the Affordable Care Act. Risk Assessment    Risk Scores      10/05/2018   Last edited by: Theodore Demark, RN   5-year risk: 0.4 %   Lifetime risk: 8.1 %            Plan:     Will get bilateral diagnostic mammogram and ultrasound for history of bilateral  nipple discharge.  Will follow up per [facility] protocol.

## 2018-10-06 ENCOUNTER — Encounter: Payer: Self-pay | Admitting: *Deleted

## 2018-10-06 NOTE — Progress Notes (Signed)
Letter mailed from the [facility] to inform patient of her normal mammogram results.  Patient is to follow-up with annual screening in two years.  Patient was informed by radiology that her bilateral nipple discharge was of no concern for breast cancer.  At the area of her palpable concern was noted a cyst.  [facility] to [place].

## 2018-11-27 ENCOUNTER — Emergency Department (HOSPITAL_COMMUNITY): Payer: Self-pay

## 2018-11-27 ENCOUNTER — Other Ambulatory Visit: Payer: Self-pay

## 2018-11-27 ENCOUNTER — Emergency Department (HOSPITAL_COMMUNITY)
Admission: EM | Admit: 2018-11-27 | Discharge: 2018-11-28 | Disposition: A | Discharge status: Home / Self Care | Payer: Self-pay | Attending: Emergency Medicine | Admitting: Emergency Medicine

## 2018-11-27 DIAGNOSIS — S29019A Strain of muscle and tendon of unspecified wall of thorax, initial encounter: Secondary | ICD-10-CM | POA: Insufficient documentation

## 2018-11-27 DIAGNOSIS — X58XXXA Exposure to other specified factors, initial encounter: Secondary | ICD-10-CM | POA: Insufficient documentation

## 2018-11-27 DIAGNOSIS — Y939 Activity, unspecified: Secondary | ICD-10-CM | POA: Insufficient documentation

## 2018-11-27 DIAGNOSIS — Z79899 Other long term (current) drug therapy: Secondary | ICD-10-CM | POA: Insufficient documentation

## 2018-11-27 DIAGNOSIS — Y999 Unspecified external cause status: Secondary | ICD-10-CM | POA: Insufficient documentation

## 2018-11-27 DIAGNOSIS — Y929 Unspecified place or not applicable: Secondary | ICD-10-CM | POA: Insufficient documentation

## 2018-11-27 LAB — I-STAT BETA HCG BLOOD, ED (MC, WL, AP ONLY): I-stat hCG, quantitative: <5 m[IU]/mL (ref <5)

## 2018-11-27 LAB — CBC
HCT: 41.8 % (ref 36.0–46.0)
Hemoglobin: 14.2 g/dL (ref 12.0–15.0)
MCH: 32.6 pg (ref 26.0–34.0)
MCHC: 34 g/dL (ref 30.0–36.0)
MCV: 95.9 fL (ref 80.0–100.0)
Platelets: 198 10*3/uL (ref 150–400)
RBC: 4.36 MIL/uL (ref 3.87–5.11)
RDW: 12.2 % (ref 11.5–15.5)
WBC: 7.5 10*3/uL (ref 4.0–10.5)
nRBC: 0 % (ref 0.0–0.2)

## 2018-11-27 LAB — PROTIME-INR
INR: 1 (ref 0.8–1.2)
Prothrombin Time: 12.8 seconds (ref 11.4–15.2)

## 2018-11-27 NOTE — ED Triage Notes (Signed)
Pt c/o CP x2 hours, along with upper back pain x3 days. Hx of PE in the past. Also c/o SOB and tightness.

## 2018-11-28 LAB — BASIC METABOLIC PANEL
Anion gap: 9 (ref 5–15)
BUN: 15 mg/dL (ref 6–20)
CO2: 24 mmol/L (ref 22–32)
Calcium: 9.3 mg/dL (ref 8.9–10.3)
Chloride: 107 mmol/L (ref 98–111)
Creatinine, Ser: 0.82 mg/dL (ref 0.44–1.00)
GFR calc Af Amer: >60 mL/min (ref >60)
GFR calc non Af Amer: >60 mL/min (ref >60)
Glucose, Bld: 89 mg/dL (ref 70–99)
Potassium: 3.7 mmol/L (ref 3.5–5.1)
Sodium: 140 mmol/L (ref 135–145)

## 2018-11-28 LAB — TROPONIN I (HIGH SENSITIVITY)
Troponin I (High Sensitivity): <2 ng/L (ref <18)
Troponin I (High Sensitivity): <2 ng/L (ref <18)

## 2018-11-28 LAB — D-DIMER, QUANTITATIVE: D-Dimer, Quant: 0.39 ug/mL-FEU (ref 0.00–0.50)

## 2018-11-28 MED ORDER — KETOROLAC TROMETHAMINE 30 MG/ML IJ SOLN
30.0000 mg | Freq: Once | INTRAMUSCULAR | Status: AC
  Administered 2018-11-28: 02:00:00 30 mg via INTRAMUSCULAR
  Filled 2018-11-28: qty 1

## 2018-11-28 NOTE — ED Notes (Signed)
Discharge instructions including pain management discussed with pt. Pt verbalized understanding with no questions at this time.  

## 2018-11-28 NOTE — ED Provider Notes (Signed)
[facility] EMERGENCY DEPARTMENT Provider Note  CSN: [id number] Arrival date & time: 11/27/18 2302  Chief Complaint(s) Chest Pain  HPI Casey Barry is a 38 y.o. female with a history of OCP induced pulmonary embolism many years ago who presents to the emergency department with 2 to 3 days of gradually worsening mid back pain that has slowly progressed to the upper left shoulder and now involving the chest.  Pain is worse with movement and palpation.  Patient denies any falls or trauma.  She endorses pleuritic pain.  Also endorses shortness of breath.  She is concerned she has a blood clot.  No recent fevers or infections.  No cough or congestion.  Pain is not exertional.  No abdominal pain.  Denies any other physical complaints.  HPI  Past Medical History Past Medical History:  Diagnosis Date  . Cystitis   . Gall stones   . Kidney stones   . Migraines   . Pulmonary embolism (HCC)    There are no active problems to display for this patient.  Home Medication(s) Prior to Admission medications   Medication Sig Start Date End Date Taking? Authorizing Provider  acetaminophen (TYLENOL) 500 MG tablet Take 1,000 mg by mouth every 6 (six) hours as needed for mild pain or moderate pain.   Yes [provider]  alprazolam Duanne Moron) 2 MG tablet Take 2 mg by mouth at bedtime as needed for anxiety.  04/30/17  Yes [provider]  etonogestrel (NEXPLANON) 68 MG IMPL implant 1 each by Subdermal route once.   Yes [provider]  oxyCODONE-acetaminophen (PERCOCET) 10-325 MG tablet Take 1 tablet by mouth every 4 (four) hours as needed for pain.   Yes [provider]                                                                                                                                    Past Surgical History Past Surgical History:  Procedure Laterality Date  . AUGMENTATION MAMMAPLASTY    . TONSILLECTOMY     Family History Family History   Problem Relation Age of Onset  . COPD Mother   . Hypertension Father   . COPD Maternal Grandmother   . Diabetes Maternal Grandmother   . Lung cancer Maternal Grandmother   . Parkinson's disease Maternal Grandfather   . Hypertension Paternal Grandmother   . Depression Paternal Grandmother   . Anxiety disorder Paternal Grandmother   . Breast cancer Other     Social History Social History   Tobacco Use  . Smoking status: Never Smoker  . Smokeless tobacco: Never Used  Substance Use Topics  . Alcohol use: No  . Drug use: No   Allergies Levaquin [levofloxacin]  Review of Systems Review of Systems All other systems are reviewed and are negative for acute change except as noted in the HPI  Physical Exam Vital Signs  I have reviewed the  triage vital signs BP 128/87   Pulse 66   Temp 99 F (37.2 C) (Oral)   Resp 18   Ht 5\' 2"  (1.575 m)   Wt 83.9 kg   LMP 11/13/2018   SpO2 100%   BMI 33.84 kg/m   Physical Exam Vitals signs reviewed.  Constitutional:      General: She is not in acute distress.    Appearance: She is well-developed. She is not diaphoretic.  HENT:     Head: Normocephalic and atraumatic.     Nose: Nose normal.  Eyes:     General: No scleral icterus.       Right eye: No discharge.        Left eye: No discharge.     Conjunctiva/sclera: Conjunctivae normal.     Pupils: Pupils are equal, round, and reactive to light.  Neck:     Musculoskeletal: Normal range of motion and neck supple.  Cardiovascular:     Rate and Rhythm: Normal rate and regular rhythm.     Heart sounds: No murmur. No friction rub. No gallop.   Pulmonary:     Effort: Pulmonary effort is normal. No respiratory distress.     Breath sounds: Normal breath sounds. No stridor. No rales.  Chest:     Chest wall: Tenderness present.    Abdominal:     General: There is no distension.     Palpations: Abdomen is soft.     Tenderness: There is no abdominal tenderness.  Musculoskeletal:      Cervical back: She exhibits tenderness and spasm.       Back:  Skin:    General: Skin is warm and dry.     Findings: No erythema or rash.  Neurological:     Mental Status: She is alert and oriented to person, place, and time.     ED Results and Treatments Labs (all labs ordered are listed, but only abnormal results are displayed) Labs Reviewed  BASIC METABOLIC PANEL  CBC  PROTIME-INR  D-DIMER, QUANTITATIVE (NOT AT [facility])  I-STAT BETA HCG BLOOD, ED ([facility], [facility], [facility] ONLY)  TROPONIN I (HIGH SENSITIVITY)  TROPONIN I (HIGH SENSITIVITY)                                                                                                                         EKG  EKG Interpretation  Date/Time:  Saturday November 27 2018 23:10:56 EST Ventricular Rate:  83 PR Interval:  136 QRS Duration: 72 QT Interval:  364 QTC Calculation: 427 R Axis:   51 Text Interpretation: Normal sinus rhythm Normal ECG No significant change since last tracing Confirmed by 10-01-1979 [id number]) on 11/28/2018 12:35:12 AM      Radiology Dg Chest 2 View  Result Date: 11/28/2018 CLINICAL DATA:  Chest pain EXAM: CHEST - 2 VIEW COMPARISON:  July 04, 2018 FINDINGS: The heart size and mediastinal contours are within normal limits. Both lungs are clear. The visualized skeletal structures are unremarkable. IMPRESSION:  No active cardiopulmonary disease. Electronically Signed   By: Jonna ClarkBindu  Avutu M.D.   On: 11/28/2018 00:10    Pertinent labs & imaging results that were available during my care of the patient were reviewed by me and considered in my medical decision making (see chart for details).  Medications Ordered in ED Medications  ketorolac (TORADOL) 30 MG/ML injection 30 mg (30 mg Intramuscular Given 11/28/18 0212)                                                                                                                                    Procedures Procedures  (including critical care time)  Medical Decision  Making / ED Course I have reviewed the nursing notes for this encounter and the patient's prior records (if available in EHR or on provided paperwork).   Casey Barry was evaluated in Emergency Department on 11/28/2018 for the symptoms described in the history of present illness. She was evaluated in the context of the global COVID-19 pandemic, which necessitated consideration that the patient might be at risk for infection with the SARS-CoV-2 virus that causes COVID-19. Institutional protocols and algorithms that pertain to the evaluation of patients at risk for COVID-19 are in a state of rapid change based on information released by regulatory bodies including the CDC and federal and state organizations. These policies and algorithms were followed during the patient's care in the ED.  Presentation is most suspicious for muscle strain/spasm of the back and left shoulder girdle regions.  Given reported shortness of breath and pleuritic chest pain with history of PEs, D-dimer was obtained but negative.  EKG without acute ischemic changes or evidence of pericarditis.  Initial troponin negative.  Delta troponin negative.  Highly doubt ACS.  Presentation not classic for aortic dissection or esophageal perforation.  Chest x-ray without evidence suggestive of pneumonia, pneumothorax, pneumomediastinum.  No abnormal contour of the mediastinum to suggest dissection. No evidence of acute injuries.       Final Clinical Impression(s) / ED Diagnoses Final diagnoses:  Strain of muscle at thorax level     The patient appears reasonably screened and/or stabilized for discharge and I doubt any other medical condition or other [facility] further screening, evaluation, or treatment in the ED at this time prior to discharge.  Disposition: Discharge  Condition: Good  I have discussed the results, Dx and Tx plan with the patient who expressed understanding and agree(s) with the plan. Discharge  instructions discussed at great length. The patient was given strict return precautions who verbalized understanding of the instructions. No further questions at time of discharge.    ED Discharge Orders    None      Follow Up: Primary care provider  Schedule an appointment as soon as possible for a visit  As needed     This chart was dictated using voice recognition software.  Despite best efforts to proofread,  errors can  occur which can change the documentation meaning.   Nira Conn, MD 11/28/18 [phone]

## 2018-11-28 NOTE — Discharge Instructions (Addendum)
You may use over-the-counter Motrin (Ibuprofen), Acetaminophen (Tylenol), topical muscle creams such as SalonPas, Icy Hot, Bengay, etc. Please stretch, apply heat, and have massage therapy for additional assistance. ° °

## 2019-03-02 ENCOUNTER — Encounter: Payer: Self-pay | Admitting: *Deleted

## 2019-03-02 NOTE — Progress Notes (Signed)
Patient referred to Dr. Lemar Livings for complaints of breast itching.  He has prescribed a cortisone cream.  She will be due for annual screening mammogram at age 39.

## 2019-12-14 ENCOUNTER — Ambulatory Visit
Admission: RE | Admit: 2019-12-14 | Discharge: 2019-12-14 | Disposition: A | Discharge status: Home / Self Care | Payer: Self-pay | Source: Ambulatory Visit | Attending: Oncology | Admitting: Oncology

## 2019-12-14 ENCOUNTER — Other Ambulatory Visit: Payer: Self-pay

## 2019-12-14 ENCOUNTER — Ambulatory Visit: Payer: Self-pay | Attending: Oncology

## 2019-12-14 ENCOUNTER — Other Ambulatory Visit: Payer: Self-pay | Admitting: Oncology

## 2019-12-14 VITALS — BP 113/82 | HR 74 | Temp 97.8°F | Ht 62.0 in | Wt 197.5 lb

## 2019-12-14 DIAGNOSIS — N63 Unspecified lump in unspecified breast: Secondary | ICD-10-CM

## 2019-12-14 NOTE — Progress Notes (Signed)
  Subjective:     Patient ID: Casey Barry, female   DOB: [birth year], 39 y.o.   MRN: [id number]  HPI   Review of Systems     Objective:   Physical Exam Chest:     Breasts:        Right: Mass present. No swelling, bleeding, inverted nipple, nipple discharge, skin change or tenderness.        Left: No swelling, bleeding, inverted nipple, mass, nipple discharge, skin change or tenderness.       Comments: Palpable soft mass        Assessment:     39 year old patient returns for [facility] screening.  Previous imaging showed cluster of cysts 6 o'clock right breast.  Patient reports area growing in size and experiencing tenderness.  Patient screened, and meets BCCCP eligibility.  Patient does not have insurance, Medicare or Medicaid. Instructed patient on breast self awareness using teach back methodClinical breast exam unremarkable when patient supine, but able to palpate 6 o'clock soft mobile mass when she is standing.      Plan:     Sent for bilateral diagnostic mammogram, and ultrasound.

## 2019-12-20 NOTE — Progress Notes (Signed)
Letter mailed from [facility] to notify of normal mammogram results.  Patient to return in one year for annual screening.  Copy to [facility]. 

## 2020-01-11 ENCOUNTER — Emergency Department (HOSPITAL_COMMUNITY)
Admission: EM | Admit: 2020-01-11 | Discharge: 2020-01-11 | Disposition: A | Discharge status: Home / Self Care | Payer: Medicaid Other | Attending: Emergency Medicine | Admitting: Emergency Medicine

## 2020-01-11 ENCOUNTER — Other Ambulatory Visit: Payer: Self-pay

## 2020-01-11 ENCOUNTER — Encounter (HOSPITAL_COMMUNITY): Payer: Self-pay | Admitting: Emergency Medicine

## 2020-01-11 DIAGNOSIS — M25512 Pain in left shoulder: Secondary | ICD-10-CM | POA: Insufficient documentation

## 2020-01-11 DIAGNOSIS — Y92410 Unspecified street and highway as the place of occurrence of the external cause: Secondary | ICD-10-CM | POA: Insufficient documentation

## 2020-01-11 DIAGNOSIS — Z5321 Procedure and treatment not carried out due to patient leaving prior to being seen by health care provider: Secondary | ICD-10-CM | POA: Insufficient documentation

## 2020-01-11 NOTE — ED Notes (Addendum)
Pt called 4x no answer  
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
## 2020-12-19 DIAGNOSIS — Z1388 Encounter for screening for disorder due to exposure to contaminants: Secondary | ICD-10-CM | POA: Diagnosis not present

## 2020-12-19 DIAGNOSIS — Z0389 Encounter for observation for other suspected diseases and conditions ruled out: Secondary | ICD-10-CM | POA: Diagnosis not present

## 2020-12-19 DIAGNOSIS — Z3009 Encounter for other general counseling and advice on contraception: Secondary | ICD-10-CM | POA: Diagnosis not present

## 2021-07-03 ENCOUNTER — Observation Stay (HOSPITAL_COMMUNITY)
Admission: EM | Admit: 2021-07-03 | Discharge: 2021-07-04 | Disposition: A | Discharge status: Home / Self Care | Payer: Medicaid Other | Attending: General Surgery | Admitting: General Surgery

## 2021-07-03 ENCOUNTER — Emergency Department (HOSPITAL_COMMUNITY): Payer: Medicaid Other

## 2021-07-03 ENCOUNTER — Other Ambulatory Visit: Payer: Self-pay

## 2021-07-03 ENCOUNTER — Encounter (HOSPITAL_COMMUNITY): Payer: Self-pay

## 2021-07-03 DIAGNOSIS — R7401 Elevation of levels of liver transaminase levels: Secondary | ICD-10-CM | POA: Insufficient documentation

## 2021-07-03 DIAGNOSIS — K8010 Calculus of gallbladder with chronic cholecystitis without obstruction: Principal | ICD-10-CM | POA: Insufficient documentation

## 2021-07-03 DIAGNOSIS — Z86711 Personal history of pulmonary embolism: Secondary | ICD-10-CM | POA: Insufficient documentation

## 2021-07-03 DIAGNOSIS — K8020 Calculus of gallbladder without cholecystitis without obstruction: Secondary | ICD-10-CM | POA: Diagnosis present

## 2021-07-03 LAB — URINALYSIS, ROUTINE W REFLEX MICROSCOPIC
Bilirubin Urine: NEGATIVE
Glucose, UA: NEGATIVE mg/dL
Ketones, ur: 15 mg/dL — AB
Leukocytes,Ua: NEGATIVE
Nitrite: NEGATIVE
Protein, ur: 30 mg/dL — AB
Specific Gravity, Urine: 1.025 (ref 1.005–1.030)
pH: 6 (ref 5.0–8.0)

## 2021-07-03 LAB — COMPREHENSIVE METABOLIC PANEL
ALT: 78 U/L — ABNORMAL HIGH (ref 0–44)
AST: 63 U/L — ABNORMAL HIGH (ref 15–41)
Albumin: 4 g/dL (ref 3.5–5.0)
Alkaline Phosphatase: 71 U/L (ref 38–126)
Anion gap: 7 (ref 5–15)
BUN: 10 mg/dL (ref 6–20)
CO2: 23 mmol/L (ref 22–32)
Calcium: 8.9 mg/dL (ref 8.9–10.3)
Chloride: 106 mmol/L (ref 98–111)
Creatinine, Ser: 0.7 mg/dL (ref 0.44–1.00)
GFR, Estimated: >60 mL/min (ref >60)
Glucose, Bld: 105 mg/dL — ABNORMAL HIGH (ref 70–99)
Potassium: 4.1 mmol/L (ref 3.5–5.1)
Sodium: 136 mmol/L (ref 135–145)
Total Bilirubin: 1.2 mg/dL (ref 0.3–1.2)
Total Protein: 7 g/dL (ref 6.5–8.1)

## 2021-07-03 LAB — CBC
HCT: 40 % (ref 36.0–46.0)
Hemoglobin: 13.4 g/dL (ref 12.0–15.0)
MCH: 32.1 pg (ref 26.0–34.0)
MCHC: 33.5 g/dL (ref 30.0–36.0)
MCV: 95.9 fL (ref 80.0–100.0)
Platelets: 145 10*3/uL — ABNORMAL LOW (ref 150–400)
RBC: 4.17 MIL/uL (ref 3.87–5.11)
RDW: 12.4 % (ref 11.5–15.5)
WBC: 9.9 10*3/uL (ref 4.0–10.5)
nRBC: 0 % (ref 0.0–0.2)

## 2021-07-03 LAB — LIPASE, BLOOD: Lipase: 25 U/L (ref 11–51)

## 2021-07-03 LAB — URINALYSIS, MICROSCOPIC (REFLEX)

## 2021-07-03 LAB — HIV ANTIBODY (ROUTINE TESTING W REFLEX): HIV Screen 4th Generation wRfx: NONREACTIVE

## 2021-07-03 LAB — TROPONIN I (HIGH SENSITIVITY)
Troponin I (High Sensitivity): 4 ng/L (ref <18)
Troponin I (High Sensitivity): 8 ng/L (ref <18)

## 2021-07-03 LAB — I-STAT BETA HCG BLOOD, ED (MC, WL, AP ONLY): I-stat hCG, quantitative: <5 m[IU]/mL (ref <5)

## 2021-07-03 LAB — MAGNESIUM: Magnesium: 2.2 mg/dL (ref 1.7–2.4)

## 2021-07-03 MED ORDER — PROCHLORPERAZINE EDISYLATE 10 MG/2ML IJ SOLN: 5.0000 mg | Freq: Four times a day (QID) | INTRAMUSCULAR | Status: DC | PRN

## 2021-07-03 MED ORDER — OXYCODONE HCL 5 MG PO TABS: 5.0000 mg | ORAL_TABLET | ORAL | Status: DC | PRN

## 2021-07-03 MED ORDER — ONDANSETRON 4 MG PO TBDP
4.0000 mg | ORAL_TABLET | Freq: Once | ORAL | Status: AC
  Administered 2021-07-03: 4 mg via ORAL
  Filled 2021-07-03: qty 1

## 2021-07-03 MED ORDER — PANTOPRAZOLE SODIUM 40 MG IV SOLR
40.0000 mg | Freq: Every day | INTRAVENOUS | Status: DC
  Administered 2021-07-03: 40 mg via INTRAVENOUS
  Filled 2021-07-03: qty 10

## 2021-07-03 MED ORDER — ACETAMINOPHEN 650 MG RE SUPP: 650.0000 mg | Freq: Four times a day (QID) | RECTAL | Status: DC | PRN

## 2021-07-03 MED ORDER — PROCHLORPERAZINE MALEATE 5 MG PO TABS: 10.0000 mg | ORAL_TABLET | Freq: Four times a day (QID) | ORAL | Status: DC | PRN

## 2021-07-03 MED ORDER — MORPHINE SULFATE (PF) 2 MG/ML IV SOLN
2.0000 mg | INTRAVENOUS | Status: DC | PRN
  Administered 2021-07-03 – 2021-07-04 (×3): 2 mg via INTRAVENOUS
  Filled 2021-07-03 (×3): qty 1

## 2021-07-03 MED ORDER — ACETAMINOPHEN 325 MG PO TABS: 650.0000 mg | ORAL_TABLET | Freq: Four times a day (QID) | ORAL | Status: DC | PRN

## 2021-07-03 MED ORDER — SODIUM CHLORIDE 0.9 % IV SOLN: INTRAVENOUS | Status: DC

## 2021-07-03 MED ORDER — POLYETHYLENE GLYCOL 3350 17 G PO PACK: 17.0000 g | PACK | Freq: Every day | ORAL | Status: DC | PRN

## 2021-07-03 MED ORDER — DOCUSATE SODIUM 100 MG PO CAPS
100.0000 mg | ORAL_CAPSULE | Freq: Two times a day (BID) | ORAL | Status: DC
  Administered 2021-07-03: 100 mg via ORAL
  Filled 2021-07-03: qty 1

## 2021-07-03 MED ORDER — METHOCARBAMOL 500 MG PO TABS: 500.0000 mg | ORAL_TABLET | Freq: Three times a day (TID) | ORAL | Status: DC | PRN

## 2021-07-03 MED ORDER — SIMETHICONE 80 MG PO CHEW: 40.0000 mg | CHEWABLE_TABLET | Freq: Four times a day (QID) | ORAL | Status: DC | PRN

## 2021-07-03 MED ORDER — OXYCODONE-ACETAMINOPHEN 5-325 MG PO TABS
2.0000 | ORAL_TABLET | Freq: Once | ORAL | Status: AC
  Administered 2021-07-03: 2 via ORAL
  Filled 2021-07-03: qty 2

## 2021-07-03 MED ORDER — METOPROLOL TARTRATE 5 MG/5ML IV SOLN: 5.0000 mg | Freq: Four times a day (QID) | INTRAVENOUS | Status: DC | PRN

## 2021-07-03 MED ORDER — ONDANSETRON HCL 4 MG/2ML IJ SOLN: 4.0000 mg | Freq: Four times a day (QID) | INTRAMUSCULAR | Status: DC | PRN

## 2021-07-03 MED ORDER — LACTATED RINGERS IV BOLUS
1000.0000 mL | Freq: Once | INTRAVENOUS | Status: AC
  Administered 2021-07-03: 1000 mL via INTRAVENOUS

## 2021-07-03 MED ORDER — DIPHENHYDRAMINE HCL 25 MG PO CAPS: 25.0000 mg | ORAL_CAPSULE | Freq: Four times a day (QID) | ORAL | Status: DC | PRN

## 2021-07-03 MED ORDER — DIPHENHYDRAMINE HCL 50 MG/ML IJ SOLN: 25.0000 mg | Freq: Four times a day (QID) | INTRAMUSCULAR | Status: DC | PRN

## 2021-07-03 MED ORDER — IOHEXOL 350 MG/ML SOLN
80.0000 mL | Freq: Once | INTRAVENOUS | Status: AC | PRN
  Administered 2021-07-03: 80 mL via INTRAVENOUS

## 2021-07-03 MED ORDER — ALPRAZOLAM 0.25 MG PO TABS: 2.0000 mg | ORAL_TABLET | Freq: Every evening | ORAL | Status: DC | PRN

## 2021-07-03 MED ORDER — METHOCARBAMOL 1000 MG/10ML IJ SOLN
500.0000 mg | Freq: Three times a day (TID) | INTRAVENOUS | Status: DC | PRN
  Filled 2021-07-03: qty 5

## 2021-07-03 MED ORDER — ONDANSETRON 4 MG PO TBDP: 4.0000 mg | ORAL_TABLET | Freq: Four times a day (QID) | ORAL | Status: DC | PRN

## 2021-07-03 MED ORDER — SODIUM CHLORIDE 0.9 % IV SOLN
2.0000 g | INTRAVENOUS | Status: DC
  Administered 2021-07-03: 2 g via INTRAVENOUS
  Filled 2021-07-03: qty 20

## 2021-07-03 MED ORDER — MORPHINE SULFATE (PF) 4 MG/ML IV SOLN
4.0000 mg | Freq: Once | INTRAVENOUS | Status: AC
  Administered 2021-07-03: 4 mg via INTRAVENOUS
  Filled 2021-07-03: qty 1

## 2021-07-03 MED ORDER — ENOXAPARIN SODIUM 40 MG/0.4ML IJ SOSY
40.0000 mg | PREFILLED_SYRINGE | INTRAMUSCULAR | Status: DC
  Administered 2021-07-03: 40 mg via SUBCUTANEOUS
  Filled 2021-07-03: qty 0.4

## 2021-07-03 NOTE — H&P (View-Only) (Signed)
Casey Barry 1980-10-08  [id number].    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 States her sxs started 3 days ago. RUQ pain is constant, severe, worse with PO intake and radiates to her back. Pain not relieved by tylenol/oxycodone. Associated with nausea.  Reports she occasionally vomits at baseline in the mornings which she has continued to do but has not had any increase episodes of vomiting.  Notes fever to 102 for the first 2 days of symptoms.  No fever today or in the ED. Denies urinary symptoms or vaginal discharge. Did have some diarrhea a few days ago which has resolved over the last 48 hours. She has been seen for similar pains in the past and was diagnosed with gallstones but never followed up with surgery.   Patient also in endorses diffuse chest tightness with intermittent stabbing pains and shortness of breath.  It is unclear if the pain is radiating from her abdomen or separate.  Notes she will occasionally have chest tightness and shortness of breath during anxiety/panic attacks but feels this feels different.  She has been under a lot of stress with her child recently being deployed in [employer], a child graduating and her also losing her job.  Her symptoms are nonexertional.  She denies history of prior heart attacks or history of CAD.  Denies any tobacco use.  She also reports intermittent paresthesias of her face and left fingertips.  The facial paresthesias began 3 days ago and have resolved.  She reports the intermittent paresthesias of her left fingertips have been present for the last several months.  No visual changes, vertigo or  weakness.  Patient received extensive work-up in the ED.  She was afebrile, without hypotension or hypoxia. Initially tachycardic which has resolved after IV fluids.  WBC 9.9.  Lipase wnl. AST 63, ALT 78, Alk Phos 71, T. Bili 1.2. Urine preg negative. Tn x 1 neg. Repeat pending. EKG. UA without obvious UTI (Leuk and nitrate neg, few bacteria, 0-5 wbc, not clean catch with 11-20 squamous cells). CTA chest negative for PE or dissection. No other acute cardiothoracic process. CT A/P w gallstones, a hiatal hernia and a few low density lesions in the renal cortex of both kidneys. RUQ [place] with chronic cholelithiasis with numerus stones, contracted GB and no gallbladder wall thickness or pericholecystic fluid. CBD 14m. Pain has improved since arrival after IV pain medication but although improved, is persistent. We were asked to see.   No prior abdominal surgeries Denies current use of blood thinners.  Denies tobacco, alcohol, or drug use. Is prescribed oxycodone for her chronic pain.    ROS:  Review of Systems  Constitutional:  Positive for chills and fever.  Eyes:  Negative for blurred vision.  Respiratory:  Positive for shortness of breath. Negative for cough.   Cardiovascular:  Positive for chest pain.  Gastrointestinal:  Positive for abdominal pain, nausea and vomiting.  Genitourinary:  Negative for dysuria, frequency and urgency.  Musculoskeletal:  Positive for back pain.  Neurological:  Positive for sensory change. Negative for focal weakness.  Psychiatric/Behavioral:  Negative for substance abuse.     Family History  Problem Relation Age of  Onset   COPD Mother    Hypertension Father    COPD Maternal Grandmother    Diabetes Maternal Grandmother    Lung cancer Maternal Grandmother    Parkinson's disease Maternal Grandfather    Hypertension Paternal Grandmother    Depression Paternal Grandmother    Anxiety disorder Paternal Grandmother    Breast cancer Other     Past Medical History:   Diagnosis Date   Cystitis    Gall stones    Kidney stones    Migraines    Pulmonary embolism ([place])     Past Surgical History:  Procedure Laterality Date   AUGMENTATION MAMMAPLASTY     TONSILLECTOMY      Social History:  reports that she has never smoked. She has never used smokeless tobacco. She reports that she does not drink alcohol and does not use drugs.  Allergies:  Allergies  Allergen Reactions   Levaquin [Levofloxacin] Anaphylaxis    (Not in a hospital admission)    Physical Exam: Blood pressure 115/62, pulse 71, temperature 98.4 F (36.9 C), resp. rate 15, height '5\' 2"'  (1.575 m), weight 72.6 kg, SpO2 99 %. General: pleasant, WD/WN white female who is laying in bed in NAD HEENT: head is normocephalic, atraumatic.  Sclera are noninjected.  PERRL.  Ears and nose without any masses or lesions.  Mouth is pink and moist. Dentition fair Heart: regular, rate, and rhythm.  Normal s1,s2. No obvious murmurs, gallops, or rubs noted.  Palpable pedal pulses bilaterally  Lungs: CTAB, no wheezes, rhonchi, or rales noted.  Respiratory effort nonlabored Abd:  Soft, ND, TTP of the RUQ without peritonitis. Negative Murphy's sign. Otherwise NT. +BS. No masses, hernias, or organomegaly MS: no BUE/BLE edema, calves soft and nontender Skin: warm and dry with no masses, lesions, or rashes Psych: A&Ox4 with an appropriate affect Neuro: cranial nerves grossly intact, equal strength in BUE/BLE bilaterally, normal speech, thought process intact, moves all extremities, gait not assessed  Results for orders placed or performed during the hospital encounter of 07/03/21 (from the past 48 hour(s))  Lipase, blood     Status: None   Collection Time: 07/03/21 10:17 AM  Result Value Ref Range   Lipase 25 11 - 51 U/L    Comment: Performed at [facility], [address], [place], [place] [address]  Comprehensive metabolic panel     Status: Abnormal   Collection Time: 07/03/21 10:17 AM  Result  Value Ref Range   Sodium 136 135 - 145 mmol/L   Potassium 4.1 3.5 - 5.1 mmol/L   Chloride 106 98 - 111 mmol/L   CO2 23 22 - 32 mmol/L   Glucose, Bld 105 (H) 70 - 99 mg/dL    Comment: Glucose reference range applies only to samples taken after fasting for at least 8 hours.   BUN 10 6 - 20 mg/dL   Creatinine, Ser 0.70 0.44 - 1.00 mg/dL   Calcium 8.9 8.9 - 10.3 mg/dL   Total Protein 7.0 6.5 - 8.1 g/dL   Albumin 4.0 3.5 - 5.0 g/dL   AST 63 (H) 15 - 41 U/L   ALT 78 (H) 0 - 44 U/L   Alkaline Phosphatase 71 38 - 126 U/L   Total Bilirubin 1.2 0.3 - 1.2 mg/dL   GFR, Estimated >60 >60 mL/min    Comment: (NOTE) Calculated using the CKD-EPI Creatinine Equation (2021)    Anion gap 7 5 - 15    Comment: Performed at [facility],  [address], [place], [place] [address]  CBC     Status: Abnormal   Collection Time: 07/03/21 10:17 AM  Result Value Ref Range   WBC 9.9 4.0 - 10.5 K/uL   RBC 4.17 3.87 - 5.11 MIL/uL   Hemoglobin 13.4 12.0 - 15.0 g/dL   HCT 40.0 36.0 - 46.0 %   MCV 95.9 80.0 - 100.0 fL   MCH 32.1 26.0 - 34.0 pg   MCHC 33.5 30.0 - 36.0 g/dL   RDW 12.4 11.5 - 15.5 %   Platelets 145 (L) 150 - 400 K/uL   nRBC 0.0 0.0 - 0.2 %    Comment: Performed at [facility], [place] [address], [place], [place] [address]  Magnesium     Status: None   Collection Time: 07/03/21 10:17 AM  Result Value Ref Range   Magnesium 2.2 1.7 - 2.4 mg/dL    Comment: Performed at [facility] [address], [place], [place] [address]  Urinalysis, Routine w reflex microscopic Urine, Clean Catch     Status: Abnormal   Collection Time: 07/03/21 10:27 AM  Result Value Ref Range   Color, Urine YELLOW YELLOW   APPearance CLEAR CLEAR   Specific Gravity, Urine 1.025 1.005 - 1.030   pH 6.0 5.0 - 8.0   Glucose, UA NEGATIVE NEGATIVE mg/dL   Hgb urine dipstick TRACE (A) NEGATIVE   Bilirubin Urine NEGATIVE NEGATIVE   Ketones, ur 15 (A) NEGATIVE mg/dL   Protein, ur 30 (A) NEGATIVE mg/dL   Nitrite  NEGATIVE NEGATIVE   Leukocytes,Ua NEGATIVE NEGATIVE    Comment: Performed at [facility] [address], [place], [place] [address]  Urinalysis, Microscopic (reflex)     Status: Abnormal   Collection Time: 07/03/21 10:27 AM  Result Value Ref Range   RBC / HPF 11-20 0 - 5 RBC/hpf   WBC, UA 0-5 0 - 5 WBC/hpf   Bacteria, UA FEW (A) NONE SEEN   Squamous Epithelial / LPF 11-20 0 - 5   Mucus PRESENT     Comment: Performed at [facility], [place] [address], [place], [place] [address]  I-Stat beta hCG blood, ED     Status: None   Collection Time: 07/03/21 10:38 AM  Result Value Ref Range   I-stat hCG, quantitative <5.0 <5 mIU/mL   Comment 3            Comment:   GEST. AGE      CONC.  (mIU/mL)   <=1 WEEK        5 - 50     2 WEEKS       50 - 500     3 WEEKS       100 - 10,000     4 WEEKS     1,000 - 30,000        FEMALE AND NON-PREGNANT FEMALE:     LESS THAN 5 mIU/mL   Troponin I (High Sensitivity)     Status: None   Collection Time: 07/03/21 12:23 PM  Result Value Ref Range   Troponin I (High Sensitivity) 4 <18 ng/L    Comment: (NOTE) Elevated high sensitivity troponin I (hsTnI) values and significant  changes across serial measurements may suggest ACS but many other  chronic and acute conditions are known to elevate hsTnI results.  Refer to the "Links" section for chest pain algorithms and additional  guidance. Performed at [facility], [place],  [place] [address]    CT ABDOMEN PELVIS W CONTRAST  Result Date: 07/03/2021 CLINICAL DATA:  Abdominal pain, nausea, vomiting EXAM: CT ABDOMEN AND PELVIS WITH CONTRAST TECHNIQUE: Multidetector CT imaging of the abdomen and pelvis was performed using the standard protocol following bolus administration of intravenous contrast. RADIATION DOSE REDUCTION: This exam was performed according to the departmental dose-optimization program which includes automated exposure control, adjustment of the mA and/or kV  according to patient size and/or use of iterative reconstruction technique. CONTRAST:  63m OMNIPAQUE IOHEXOL 350 MG/ML SOLN COMPARISON:  05/30/2014 FINDINGS: Lower chest: Unremarkable. Hepatobiliary: Liver measures 20.9 cm in length. There is no dilation of bile ducts. There are multiple calcified gallbladder stones. Pancreas: No focal abnormality is seen. Spleen: Unremarkable. Adrenals/Urinary Tract: Adrenals are unremarkable. There is no hydronephrosis. There is 1.3 cm low-density lesion with ill-defined margins in the upper pole of right kidney. Density measurements are higher than usual for simple cyst. There is another ill-defined 7 mm low-density in the medial midportion of right kidney. There is 3 mm low-density in the lower pole left kidney. 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 There are no renal or ureteral stones. Urinary bladder is not distended. Stomach/Bowel: Small hiatal hernia is seen. Small bowel loops are not dilated. Appendix is not seen. There is no pericecal inflammation. There is no significant wall thickening in colon. There is no pericolic stranding. Few diverticula are seen in the colon. Vascular/Lymphatic: Unremarkable. Reproductive: There is 2.3 cm low-density structure in the left adnexa, possibly follicle. Other: There is no ascites or pneumoperitoneum. Small umbilical hernia containing fat is seen. Musculoskeletal: Unremarkable. IMPRESSION: There is no evidence of intestinal obstruction or pneumoperitoneum. There is no hydronephrosis. There are few low-density lesions in the renal cortex in both kidneys. Density measurements are higher than usual for simple cysts. Follow-up renal sonogram may be considered. Gallbladder stones.  Small hiatal hernia. Other findings as described in the body of the report. Electronically Signed   By: PElmer PickerM.D.   On: 07/03/2021 13:52   CT Angio Chest PE W and/or Wo Contrast  Result Date: 07/03/2021 CLINICAL DATA:  Chest pain  EXAM: CT ANGIOGRAPHY CHEST WITH CONTRAST TECHNIQUE: Multidetector CT imaging of the chest was performed using the standard protocol during bolus administration of intravenous contrast. Multiplanar CT image reconstructions and MIPs were obtained to evaluate the vascular anatomy. RADIATION DOSE REDUCTION: This exam was performed according to the departmental dose-optimization program which includes automated exposure control, adjustment of the mA and/or kV according to patient size and/or use of iterative reconstruction technique. CONTRAST:  832mOMNIPAQUE IOHEXOL 350 MG/ML SOLN COMPARISON:  04/27/2017 FINDINGS: Cardiovascular: There is homogeneous enhancement in thoracic aorta. There is ectasia of ascending thoracic aorta measuring 3.6 cm. There is mild ectasia of main pulmonary artery measuring 3.1 cm. There are no intraluminal filling defects in the pulmonary artery branches. Mediastinum/Nodes: No significant lymphadenopathy is seen. Beam hardening artifacts caused by patient's necklace limits evaluation of lower neck. Lungs/Pleura: There is no focal pulmonary consolidation. There is no pleural effusion or pneumothorax. Upper Abdomen: Gallbladder stones are seen. Small hiatal hernia is seen. Musculoskeletal: Unremarkable. Augmentation prostheses are seen in both breasts. Review of the MIP images confirms the above findings. IMPRESSION: There is no evidence of thoracic aortic dissection. There is no evidence of pulmonary artery embolism. There is no focal pulmonary consolidation. Gallbladder stones. Small hiatal hernia. There is ectasia of ascending thoracic aorta measuring 3.6 cm. Electronically Signed   By: PaElmer Picker  M.D.   On: 07/03/2021 13:33   US Abdomen Limited RUQ (LIVER/GB)  Result Date: 07/03/2021 CLINICAL DATA:  41 year old female with 4 days of right upper quadrant pain. EXAM: ULTRASOUND ABDOMEN LIMITED RIGHT UPPER QUADRANT COMPARISON:  Abdomen ultrasound 01/22/2014. FINDINGS: Gallbladder:  Shadowing gallstones, individually up to 11 mm (image 5). Wall echo shadow (WES sign) appearance on some images (image 6). But no sonographic Murphy sign elicited, and gallbladder wall thickness appears to remain normal. No pericholecystic fluid identified. Common bile duct: Diameter: 3 mm, normal. Liver: No focal lesion identified. Within normal limits in parenchymal echogenicity. Portal vein is patent on color Doppler imaging with normal direction of blood flow towards the liver. Other: Negative visible right kidney. IMPRESSION: 1. Chronic cholelithiasis, with a Wall-echo-shadow (WES sign) appearance suggesting a gallbladder contracted around numerous stones. But no wall thickening or sonographic Murphy sign elicited to suggest acute cholecystitis. 2. No evidence of bile duct obstruction. Electronically Signed   By: Genevie Ann M.D.   On: 07/03/2021 11:07     Assessment/Plan RUQ abdominal pain Cholelithiasis Elevated LFTs  - Patient has been seen and examined. Vitals, labs, I/O, imaging and EDP notes have been reviewed. Casey Barry is a 42 y.o. female with known hx of gallstones who presented with RUQ abdominal pain, worse after meals, with associated with nausea that consistent with biliary colic. I do suspect that her abdominal pain is causes by worsening symptomatic cholelithiasis. ACS/chest pain workup negative for acute cardiac/pulmonary process. Discussed with attending. Will plan for admission and laproscopic Cholecystectomy in the AM.  I have explained the procedure, risks, and aftercare of Laparoscopic cholecystectomy with IOC.  Risks include but are not limited to anesthesia (MI, CVA, death), bleeding, infection, wound problems, hernia, bile leak, injury to common bile duct/liver/intestine, increased risk of DVT/PE and diarrhea post op.  She seems to understand and agrees to proceed. Admit to observation. Okay for CLD. NPO at midnight. AM labs.   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

## 2021-07-03 NOTE — ED Triage Notes (Signed)
Pt arrived POV from home c/o centralized CP that feels like a stabbing pain that started 3 days ago. Pt also endorses fever, and right sided rib cage pain, and facial numbness x 3 days.

## 2021-07-03 NOTE — H&P (Incomplete)
Casey Barry [facility] [birth year]  [id number].    Requesting MD: *** Chief Complaint/Reason for Consult: ***  HPI:  ***  ROS: ROS  Family History  Problem Relation Age of Onset   COPD Mother    Hypertension Father    COPD Maternal Grandmother    Diabetes Maternal Grandmother    Lung cancer Maternal Grandmother    Parkinson's disease Maternal Grandfather    Hypertension Paternal Grandmother    Depression Paternal Grandmother    Anxiety disorder Paternal Grandmother    Breast cancer Other     Past Medical History:  Diagnosis Date   Cystitis    Gall stones    Kidney stones    Migraines    Pulmonary embolism (HCC)     Past Surgical History:  Procedure Laterality Date   AUGMENTATION MAMMAPLASTY     TONSILLECTOMY      Social History:  reports that she has never smoked. She has never used smokeless tobacco. She reports that she does not drink alcohol and does not use drugs.  Allergies:  Allergies  Allergen Reactions   Levaquin [Levofloxacin] Anaphylaxis    (Not in a hospital admission)    Physical Exam: Blood pressure 115/62, pulse 71, temperature 98.4 F (36.9 C), resp. rate 15, height 5\' 2"  (1.575 Barry), weight 72.6 kg, SpO2 99 %. ***  Results for orders placed or performed during the hospital encounter of 07/03/21 (from the past 48 hour(s))  Lipase, blood     Status: None   Collection Time: 07/03/21 10:17 AM  Result Value Ref Range   Lipase 25 11 - 51 U/L    Comment: Performed at [facility], [address], [place], [place]  Comprehensive metabolic panel     Status: Abnormal   Collection Time: 07/03/21 10:17 AM  Result Value Ref Range   Sodium 136 135 - 145 mmol/L   Potassium 4.1 3.5 - 5.1 mmol/L   Chloride 106 98 - 111 mmol/L   CO2 23 22 - 32 mmol/L   Glucose, Bld 105 (H) 70 - 99 mg/dL    Comment: Glucose reference range applies only to samples taken after fasting for at least 8 hours.   BUN 10 6 - 20 mg/dL   Creatinine, Ser 07/05/21  0.44 - 1.00 mg/dL   Calcium 8.9 8.9 - 1.76 mg/dL   Total Protein 7.0 6.5 - 8.1 g/dL   Albumin 4.0 3.5 - 5.0 g/dL   AST 63 (H) 15 - 41 U/L   ALT 78 (H) 0 - 44 U/L   Alkaline Phosphatase 71 38 - 126 U/L   Total Bilirubin 1.2 0.3 - 1.2 mg/dL   GFR, Estimated 16.0 >73 mL/min    Comment: (NOTE) Calculated using the CKD-EPI Creatinine Equation (2021)    Anion gap 7 5 - 15    Comment: Performed at [facility], [address], [place], [place]  CBC     Status: Abnormal   Collection Time: 07/03/21 10:17 AM  Result Value Ref Range   WBC 9.9 4.0 - 10.5 K/uL   RBC 4.17 3.87 - 5.11 MIL/uL   Hemoglobin 13.4 12.0 - 15.0 g/dL   HCT 07/05/21 48.5 - 46.2 %   MCV 95.9 80.0 - 100.0 fL   MCH 32.1 26.0 - 34.0 pg   MCHC 33.5 30.0 - 36.0 g/dL   RDW 70.3 50.0 - 93.8 %   Platelets 145 (L) 150 - 400 K/uL   nRBC 0.0  0.0 - 0.2 %    Comment: Performed at [facility], [address] [place], [place] [address]  Magnesium     Status: None   Collection Time: 07/03/21 10:17 AM  Result Value Ref Range   Magnesium 2.2 1.7 - 2.4 mg/dL    Comment: Performed at [facility], [address] [place], [place] [address]  Urinalysis, Routine w reflex microscopic Urine, Clean Catch     Status: Abnormal   Collection Time: 07/03/21 10:27 AM  Result Value Ref Range   Color, Urine YELLOW YELLOW   APPearance CLEAR CLEAR   Specific Gravity, Urine 1.025 1.005 - 1.030   pH 6.0 5.0 - 8.0   Glucose, UA NEGATIVE NEGATIVE mg/dL   Hgb urine dipstick TRACE (A) NEGATIVE   Bilirubin Urine NEGATIVE NEGATIVE   Ketones, ur 15 (A) NEGATIVE mg/dL   Protein, ur 30 (A) NEGATIVE mg/dL   Nitrite NEGATIVE NEGATIVE   Leukocytes,Ua NEGATIVE NEGATIVE    Comment: Performed at [facility], [address] [place], [place] [address]  Urinalysis, Microscopic (reflex)     Status: Abnormal   Collection Time: 07/03/21 10:27 AM  Result Value Ref Range   RBC / HPF 11-20 0 - 5 RBC/hpf   WBC, UA 0-5 0 - 5 WBC/hpf    Bacteria, UA FEW (A) NONE SEEN   Squamous Epithelial / LPF 11-20 0 - 5   Mucus PRESENT     Comment: Performed at [facility], [address] [place], [place] [address]  I-Stat beta hCG blood, ED     Status: None   Collection Time: 07/03/21 10:38 AM  Result Value Ref Range   I-stat hCG, quantitative <5.0 <5 mIU/mL   Comment 3            Comment:   GEST. AGE      CONC.  (mIU/mL)   <=1 WEEK        5 - 50     2 WEEKS       50 - 500     3 WEEKS       100 - 10,000     4 WEEKS     1,000 - 30,000        FEMALE AND NON-PREGNANT FEMALE:     LESS THAN 5 mIU/mL   Troponin I (High Sensitivity)     Status: None   Collection Time: 07/03/21 12:23 PM  Result Value Ref Range   Troponin I (High Sensitivity) 4 <18 ng/L    Comment: (NOTE) Elevated high sensitivity troponin I (hsTnI) values and significant  changes across serial measurements may suggest ACS but many other  chronic and acute conditions are known to elevate hsTnI results.  Refer to the "Links" section for chest pain algorithms and additional  guidance. Performed at [facility], [address] [place], [place] [address]    CT ABDOMEN PELVIS W CONTRAST  Result Date: 07/03/2021 CLINICAL DATA:  Abdominal pain, nausea, vomiting EXAM: CT ABDOMEN AND PELVIS WITH CONTRAST TECHNIQUE: Multidetector CT imaging of the abdomen and pelvis was performed using the standard protocol following bolus administration of intravenous contrast. RADIATION DOSE REDUCTION: This exam was performed according to the departmental dose-optimization program which includes automated exposure control, adjustment of the mA and/or kV according to patient size and/or use of iterative reconstruction technique. CONTRAST:  80mL OMNIPAQUE IOHEXOL 350 MG/ML SOLN COMPARISON:  05/30/2014 FINDINGS: Lower chest: Unremarkable. Hepatobiliary: Liver measures 20.9 cm in  length. There is no dilation of bile ducts. There are multiple calcified gallbladder stones. Pancreas: No  focal abnormality is seen. Spleen: Unremarkable. Adrenals/Urinary Tract: Adrenals are unremarkable. There is no hydronephrosis. There is 1.3 cm low-density lesion with ill-defined margins in the upper pole of right kidney. Density measurements are higher than usual for simple cyst. There is another ill-defined 7 mm low-density in the medial midportion of right kidney. There is 3 mm low-density in the lower pole left kidney. 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 There are no renal or ureteral stones. Urinary bladder is not distended. Stomach/Bowel: Small hiatal hernia is seen. Small bowel loops are not dilated. Appendix is not seen. There is no pericecal inflammation. There is no significant wall thickening in colon. There is no pericolic stranding. Few diverticula are seen in the colon. Vascular/Lymphatic: Unremarkable. Reproductive: There is 2.3 cm low-density structure in the left adnexa, possibly follicle. Other: There is no ascites or pneumoperitoneum. Small umbilical hernia containing fat is seen. Musculoskeletal: Unremarkable. IMPRESSION: There is no evidence of intestinal obstruction or pneumoperitoneum. There is no hydronephrosis. There are few low-density lesions in the renal cortex in both kidneys. Density measurements are higher than usual for simple cysts. Follow-up renal sonogram may be considered. Gallbladder stones.  Small hiatal hernia. Other findings as described in the body of the report. Electronically Signed   Ernie Avenainasamy Barry.D.   On: 07/03/2021 13:52   CT Angio Chest PE W and/or Wo Contrast  Result Date: 07/03/2021 CLINICAL DATA:  Chest pain EXAM: CT ANGIOGRAPHY CHEST WITH CONTRAST TECHNIQUE: Multidetector CT imaging of the chest was performed using the standard protocol during bolus administration of intravenous contrast. Multiplanar CT image reconstructions and MIPs were obtained to evaluate the vascular anatomy. RADIATION DOSE REDUCTION: This exam was performed  according to the departmental dose-optimization program which includes automated exposure control, adjustment of the mA and/or kV according to patient size and/or use of iterative reconstruction technique. CONTRAST:  80mL OMNIPAQUE IOHEXOL 350 MG/ML SOLN COMPARISON:  04/27/2017 FINDINGS: Cardiovascular: There is homogeneous enhancement in thoracic aorta. There is ectasia of ascending thoracic aorta measuring 3.6 cm. There is mild ectasia of main pulmonary artery measuring 3.1 cm. There are no intraluminal filling defects in the pulmonary artery branches. Mediastinum/Nodes: No significant lymphadenopathy is seen. Beam hardening artifacts caused by patient's necklace limits evaluation of lower neck. Lungs/Pleura: There is no focal pulmonary consolidation. There is no pleural effusion or pneumothorax. Upper Abdomen: Gallbladder stones are seen. Small hiatal hernia is seen. Musculoskeletal: Unremarkable. Augmentation prostheses are seen in both breasts. Review of the MIP images confirms the above findings. IMPRESSION: There is no evidence of thoracic aortic dissection. There is no evidence of pulmonary artery embolism. There is no focal pulmonary consolidation. Gallbladder stones. Small hiatal hernia. There is ectasia of ascending thoracic aorta measuring 3.6 cm. Electronically Signed   By: Ernie Avena Barry.D.   On: 07/03/2021 13:33   US Abdomen Limited RUQ (LIVER/GB)  Result Date: 07/03/2021 CLINICAL DATA:  41 year old female with 4 days of right upper quadrant pain. EXAM: ULTRASOUND ABDOMEN LIMITED RIGHT UPPER QUADRANT COMPARISON:  Abdomen ultrasound 01/22/2014. FINDINGS: Gallbladder: Shadowing gallstones, individually up to 11 mm (image 5). Wall echo shadow (WES sign) appearance on some images (image 6). But no sonographic Murphy sign elicited, and gallbladder wall thickness appears to remain normal. No pericholecystic fluid identified. Common bile duct: Diameter: 3 mm, normal. Liver: No focal lesion  identified. Within normal limits in parenchymal echogenicity. Portal vein is  patent on color Doppler imaging with normal direction of blood flow towards the liver. Other: Negative visible right kidney. IMPRESSION: 1. Chronic cholelithiasis, with a Wall-echo-shadow (WES sign) appearance suggesting a gallbladder contracted around numerous stones. But no wall thickening or sonographic Murphy sign elicited to suggest acute cholecystitis. 2. No evidence of bile duct obstruction. Electronically Signed   By: Odessa Fleming Barry.D.   On: 07/03/2021 11:07    Anti-infectives (From admission, onward)    None       Assessment/Plan ***   FEN - *** VTE - *** ID - *** Foley -  Dispo - Admit to ***.   I reviewed {Reviewed data:26882::"last 24 h vitals and pain scores","last 48 h intake and output","last 24 h labs and trends","last 24 h imaging results"}.  This care required {MDM levels:26883} level of medical decision making.   Jacinto Halim, [facility] 07/03/2021, 3:25 PM Please see Amion for pager number during day hours 7:00am-4:30pm

## 2021-07-03 NOTE — ED Notes (Signed)
Patient transported to Ultrasound 

## 2021-07-03 NOTE — Consult Note (Signed)
Casey Barry 1980/03/22  [id number].    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 States her sxs started 3 days ago. RUQ pain is constant, severe, worse with PO intake and radiates to her back. Pain not relieved by tylenol/oxycodone. Associated with nausea.  Reports she occasionally vomits at baseline in the mornings which she has continued to do but has not had any increase episodes of vomiting.  Notes fever to 102 for the first 2 days of symptoms.  No fever today or in the ED. Denies urinary symptoms or vaginal discharge. Did have some diarrhea a few days ago which has resolved over the last 48 hours. She has been seen for similar pains in the past and was diagnosed with gallstones but never followed up with surgery.   Patient also in endorses diffuse chest tightness with intermittent stabbing pains and shortness of breath.  It is unclear if the pain is radiating from her abdomen or separate.  Notes she will occasionally have chest tightness and shortness of breath during anxiety/panic attacks but feels this feels different.  She has been under a lot of stress with her child recently being deployed in [employer], a child graduating and her also losing her job.  Her symptoms are nonexertional.  She denies history of prior heart attacks or history of CAD.  Denies any tobacco use.  She also reports intermittent paresthesias of her face and left fingertips.  The facial paresthesias began 3 days ago and have resolved.  She reports the intermittent paresthesias of her left fingertips have been present for the last several months.  No visual changes, vertigo or  weakness.  Patient received extensive work-up in the ED.  She was afebrile, without hypotension or hypoxia. Initially tachycardic which has resolved after IV fluids.  WBC 9.9.  Lipase wnl. AST 63, ALT 78, Alk Phos 71, T. Bili 1.2. Urine preg negative. Tn x 1 neg. Repeat pending. EKG. UA without obvious UTI (Leuk and nitrate neg, few bacteria, 0-5 wbc, not clean catch with 11-20 squamous cells). CTA chest negative for PE or dissection. No other acute cardiothoracic process. CT A/P w gallstones, a hiatal hernia and a few low density lesions in the renal cortex of both kidneys. RUQ [place] with chronic cholelithiasis with numerus stones, contracted GB and no gallbladder wall thickness or pericholecystic fluid. CBD 75m. Pain has improved since arrival after IV pain medication but although improved, is persistent. We were asked to see.   No prior abdominal surgeries Denies current use of blood thinners.  Denies tobacco, alcohol, or drug use. Is prescribed oxycodone for her chronic pain.    ROS:  Review of Systems  Constitutional:  Positive for chills and fever.  Eyes:  Negative for blurred vision.  Respiratory:  Positive for shortness of breath. Negative for cough.   Cardiovascular:  Positive for chest pain.  Gastrointestinal:  Positive for abdominal pain, nausea and vomiting.  Genitourinary:  Negative for dysuria, frequency and urgency.  Musculoskeletal:  Positive for back pain.  Neurological:  Positive for sensory change. Negative for focal weakness.  Psychiatric/Behavioral:  Negative for substance abuse.     Family History  Problem Relation Age of  Onset   COPD Mother    Hypertension Father    COPD Maternal Grandmother    Diabetes Maternal Grandmother    Lung cancer Maternal Grandmother    Parkinson's disease Maternal Grandfather    Hypertension Paternal Grandmother    Depression Paternal Grandmother    Anxiety disorder Paternal Grandmother    Breast cancer Other     Past Medical History:   Diagnosis Date   Cystitis    Gall stones    Kidney stones    Migraines    Pulmonary embolism ([place])     Past Surgical History:  Procedure Laterality Date   AUGMENTATION MAMMAPLASTY     TONSILLECTOMY      Social History:  reports that she has never smoked. She has never used smokeless tobacco. She reports that she does not drink alcohol and does not use drugs.  Allergies:  Allergies  Allergen Reactions   Levaquin [Levofloxacin] Anaphylaxis    (Not in a hospital admission)    Physical Exam: Blood pressure 115/62, pulse 71, temperature 98.4 F (36.9 C), resp. rate 15, height '5\' 2"'  (1.575 m), weight 72.6 kg, SpO2 99 %. General: pleasant, WD/WN white female who is laying in bed in NAD HEENT: head is normocephalic, atraumatic.  Sclera are noninjected.  PERRL.  Ears and nose without any masses or lesions.  Mouth is pink and moist. Dentition fair Heart: regular, rate, and rhythm.  Normal s1,s2. No obvious murmurs, gallops, or rubs noted.  Palpable pedal pulses bilaterally  Lungs: CTAB, no wheezes, rhonchi, or rales noted.  Respiratory effort nonlabored Abd:  Soft, ND, TTP of the RUQ without peritonitis. Negative Murphy's sign. Otherwise NT. +BS. No masses, hernias, or organomegaly MS: no BUE/BLE edema, calves soft and nontender Skin: warm and dry with no masses, lesions, or rashes Psych: A&Ox4 with an appropriate affect Neuro: cranial nerves grossly intact, equal strength in BUE/BLE bilaterally, normal speech, thought process intact, moves all extremities, gait not assessed  Results for orders placed or performed during the hospital encounter of 07/03/21 (from the past 48 hour(s))  Lipase, blood     Status: None   Collection Time: 07/03/21 10:17 AM  Result Value Ref Range   Lipase 25 11 - 51 U/L    Comment: Performed at [facility], [address], [place], [place] [address]  Comprehensive metabolic panel     Status: Abnormal   Collection Time: 07/03/21 10:17 AM  Result  Value Ref Range   Sodium 136 135 - 145 mmol/L   Potassium 4.1 3.5 - 5.1 mmol/L   Chloride 106 98 - 111 mmol/L   CO2 23 22 - 32 mmol/L   Glucose, Bld 105 (H) 70 - 99 mg/dL    Comment: Glucose reference range applies only to samples taken after fasting for at least 8 hours.   BUN 10 6 - 20 mg/dL   Creatinine, Ser 0.70 0.44 - 1.00 mg/dL   Calcium 8.9 8.9 - 10.3 mg/dL   Total Protein 7.0 6.5 - 8.1 g/dL   Albumin 4.0 3.5 - 5.0 g/dL   AST 63 (H) 15 - 41 U/L   ALT 78 (H) 0 - 44 U/L   Alkaline Phosphatase 71 38 - 126 U/L   Total Bilirubin 1.2 0.3 - 1.2 mg/dL   GFR, Estimated >60 >60 mL/min    Comment: (NOTE) Calculated using the CKD-EPI Creatinine Equation (2021)    Anion gap 7 5 - 15    Comment: Performed at [facility],  [address] [place], [place] [address]  CBC     Status: Abnormal   Collection Time: 07/03/21 10:17 AM  Result Value Ref Range   WBC 9.9 4.0 - 10.5 K/uL   RBC 4.17 3.87 - 5.11 MIL/uL   Hemoglobin 13.4 12.0 - 15.0 g/dL   HCT 40.0 36.0 - 46.0 %   MCV 95.9 80.0 - 100.0 fL   MCH 32.1 26.0 - 34.0 pg   MCHC 33.5 30.0 - 36.0 g/dL   RDW 12.4 11.5 - 15.5 %   Platelets 145 (L) 150 - 400 K/uL   nRBC 0.0 0.0 - 0.2 %    Comment: Performed at [facility], [place] [address], [place], [place] [address]  Magnesium     Status: None   Collection Time: 07/03/21 10:17 AM  Result Value Ref Range   Magnesium 2.2 1.7 - 2.4 mg/dL    Comment: Performed at [facility] [address] [place], [place] [address]  Urinalysis, Routine w reflex microscopic Urine, Clean Catch     Status: Abnormal   Collection Time: 07/03/21 10:27 AM  Result Value Ref Range   Color, Urine YELLOW YELLOW   APPearance CLEAR CLEAR   Specific Gravity, Urine 1.025 1.005 - 1.030   pH 6.0 5.0 - 8.0   Glucose, UA NEGATIVE NEGATIVE mg/dL   Hgb urine dipstick TRACE (A) NEGATIVE   Bilirubin Urine NEGATIVE NEGATIVE   Ketones, ur 15 (A) NEGATIVE mg/dL   Protein, ur 30 (A) NEGATIVE mg/dL   Nitrite  NEGATIVE NEGATIVE   Leukocytes,Ua NEGATIVE NEGATIVE    Comment: Performed at [facility] [address] [place], [place] [address]  Urinalysis, Microscopic (reflex)     Status: Abnormal   Collection Time: 07/03/21 10:27 AM  Result Value Ref Range   RBC / HPF 11-20 0 - 5 RBC/hpf   WBC, UA 0-5 0 - 5 WBC/hpf   Bacteria, UA FEW (A) NONE SEEN   Squamous Epithelial / LPF 11-20 0 - 5   Mucus PRESENT     Comment: Performed at [facility], [place] [address], [place], [place] [address]  I-Stat beta hCG blood, ED     Status: None   Collection Time: 07/03/21 10:38 AM  Result Value Ref Range   I-stat hCG, quantitative <5.0 <5 mIU/mL   Comment 3            Comment:   GEST. AGE      CONC.  (mIU/mL)   <=1 WEEK        5 - 50     2 WEEKS       50 - 500     3 WEEKS       100 - 10,000     4 WEEKS     1,000 - 30,000        FEMALE AND NON-PREGNANT FEMALE:     LESS THAN 5 mIU/mL   Troponin I (High Sensitivity)     Status: None   Collection Time: 07/03/21 12:23 PM  Result Value Ref Range   Troponin I (High Sensitivity) 4 <18 ng/L    Comment: (NOTE) Elevated high sensitivity troponin I (hsTnI) values and significant  changes across serial measurements may suggest ACS but many other  chronic and acute conditions are known to elevate hsTnI results.  Refer to the "Links" section for chest pain algorithms and additional  guidance. Performed at [facility], [place],  [place] [address]    CT ABDOMEN PELVIS W CONTRAST  Result Date: 07/03/2021 CLINICAL DATA:  Abdominal pain, nausea, vomiting EXAM: CT ABDOMEN AND PELVIS WITH CONTRAST TECHNIQUE: Multidetector CT imaging of the abdomen and pelvis was performed using the standard protocol following bolus administration of intravenous contrast. RADIATION DOSE REDUCTION: This exam was performed according to the departmental dose-optimization program which includes automated exposure control, adjustment of the mA and/or kV  according to patient size and/or use of iterative reconstruction technique. CONTRAST:  65m OMNIPAQUE IOHEXOL 350 MG/ML SOLN COMPARISON:  05/30/2014 FINDINGS: Lower chest: Unremarkable. Hepatobiliary: Liver measures 20.9 cm in length. There is no dilation of bile ducts. There are multiple calcified gallbladder stones. Pancreas: No focal abnormality is seen. Spleen: Unremarkable. Adrenals/Urinary Tract: Adrenals are unremarkable. There is no hydronephrosis. There is 1.3 cm low-density lesion with ill-defined margins in the upper pole of right kidney. Density measurements are higher than usual for simple cyst. There is another ill-defined 7 mm low-density in the medial midportion of right kidney. There is 3 mm low-density in the lower pole left kidney. 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 There are no renal or ureteral stones. Urinary bladder is not distended. Stomach/Bowel: Small hiatal hernia is seen. Small bowel loops are not dilated. Appendix is not seen. There is no pericecal inflammation. There is no significant wall thickening in colon. There is no pericolic stranding. Few diverticula are seen in the colon. Vascular/Lymphatic: Unremarkable. Reproductive: There is 2.3 cm low-density structure in the left adnexa, possibly follicle. Other: There is no ascites or pneumoperitoneum. Small umbilical hernia containing fat is seen. Musculoskeletal: Unremarkable. IMPRESSION: There is no evidence of intestinal obstruction or pneumoperitoneum. There is no hydronephrosis. There are few low-density lesions in the renal cortex in both kidneys. Density measurements are higher than usual for simple cysts. Follow-up renal sonogram may be considered. Gallbladder stones.  Small hiatal hernia. Other findings as described in the body of the report. Electronically Signed   By: PElmer PickerM.D.   On: 07/03/2021 13:52   CT Angio Chest PE W and/or Wo Contrast  Result Date: 07/03/2021 CLINICAL DATA:  Chest pain  EXAM: CT ANGIOGRAPHY CHEST WITH CONTRAST TECHNIQUE: Multidetector CT imaging of the chest was performed using the standard protocol during bolus administration of intravenous contrast. Multiplanar CT image reconstructions and MIPs were obtained to evaluate the vascular anatomy. RADIATION DOSE REDUCTION: This exam was performed according to the departmental dose-optimization program which includes automated exposure control, adjustment of the mA and/or kV according to patient size and/or use of iterative reconstruction technique. CONTRAST:  859mOMNIPAQUE IOHEXOL 350 MG/ML SOLN COMPARISON:  04/27/2017 FINDINGS: Cardiovascular: There is homogeneous enhancement in thoracic aorta. There is ectasia of ascending thoracic aorta measuring 3.6 cm. There is mild ectasia of main pulmonary artery measuring 3.1 cm. There are no intraluminal filling defects in the pulmonary artery branches. Mediastinum/Nodes: No significant lymphadenopathy is seen. Beam hardening artifacts caused by patient's necklace limits evaluation of lower neck. Lungs/Pleura: There is no focal pulmonary consolidation. There is no pleural effusion or pneumothorax. Upper Abdomen: Gallbladder stones are seen. Small hiatal hernia is seen. Musculoskeletal: Unremarkable. Augmentation prostheses are seen in both breasts. Review of the MIP images confirms the above findings. IMPRESSION: There is no evidence of thoracic aortic dissection. There is no evidence of pulmonary artery embolism. There is no focal pulmonary consolidation. Gallbladder stones. Small hiatal hernia. There is ectasia of ascending thoracic aorta measuring 3.6 cm. Electronically Signed   By: PaElmer Picker  M.D.   On: 07/03/2021 13:33   US Abdomen Limited RUQ (LIVER/GB)  Result Date: 07/03/2021 CLINICAL DATA:  41 year old female with 4 days of right upper quadrant pain. EXAM: ULTRASOUND ABDOMEN LIMITED RIGHT UPPER QUADRANT COMPARISON:  Abdomen ultrasound 01/22/2014. FINDINGS: Gallbladder:  Shadowing gallstones, individually up to 11 mm (image 5). Wall echo shadow (WES sign) appearance on some images (image 6). But no sonographic Murphy sign elicited, and gallbladder wall thickness appears to remain normal. No pericholecystic fluid identified. Common bile duct: Diameter: 3 mm, normal. Liver: No focal lesion identified. Within normal limits in parenchymal echogenicity. Portal vein is patent on color Doppler imaging with normal direction of blood flow towards the liver. Other: Negative visible right kidney. IMPRESSION: 1. Chronic cholelithiasis, with a Wall-echo-shadow (WES sign) appearance suggesting a gallbladder contracted around numerous stones. But no wall thickening or sonographic Murphy sign elicited to suggest acute cholecystitis. 2. No evidence of bile duct obstruction. Electronically Signed   By: Genevie Ann M.D.   On: 07/03/2021 11:07     Assessment/Plan RUQ abdominal pain Cholelithiasis Elevated LFTs  - Patient has been seen and examined. Vitals, labs, I/O, imaging and EDP notes have been reviewed. Amelia Jo is a 41 y.o. female with known hx of gallstones who presented with RUQ abdominal pain, worse after meals, with associated with nausea that consistent with biliary colic. I do suspect that her abdominal pain is causes by worsening symptomatic cholelithiasis. ACS/chest pain workup negative for acute cardiac/pulmonary process. Discussed with attending. Will plan for admission and laproscopic Cholecystectomy in the AM.  I have explained the procedure, risks, and aftercare of Laparoscopic cholecystectomy with IOC.  Risks include but are not limited to anesthesia (MI, CVA, death), bleeding, infection, wound problems, hernia, bile leak, injury to common bile duct/liver/intestine, increased risk of DVT/PE and diarrhea post op.  She seems to understand and agrees to proceed. Admit to observation. Okay for CLD. NPO at midnight. AM labs.   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

## 2021-07-03 NOTE — ED Provider Notes (Signed)
[facility]   CSN: [id number] Arrival date & time: 07/03/21  [id number]     History  Chief Complaint  Patient presents with   Chest Pain   Abdominal Pain    Casey Barry is a 41 y.o. female.   Chest Pain Associated symptoms: abdominal pain, back pain, fever and nausea   Abdominal Pain Associated symptoms: chest pain, chills, fever and nausea   Patient presents for right upper quadrant abdominal pain, chest pain, fevers, and migrating paresthesias.  The symptoms have been present over the past 3 days.  Patient states that she was in her normal state of health 4 days ago.  She does describe prior episodes of right upper quadrant pain.  She has been told that she has gallstones but never followed up with surgery for further consultation.  She does note that her right upper quadrant pain is worsened with eating.  She has had intermittent nausea without vomiting.  She had diarrhea several days ago which resolved.  Her last bowel movement was 2 days ago.  Patient reports that she has had high fevers at home.  She treats these fevers with Tylenol and feels that they return shortly thereafter.  She has had decreased p.o. intake.  She describes migrating paresthesias that are intermittent.  Today she endorses chest pain and pressure.  She has pleuritic pain from her right upper quadrant.  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  She is not currently on any blood thinners.  Patient typically takes oxycodone every day for chronic pain.  She ran out of this medication several days ago.  This was shortly before the onset of her current symptoms.  She did find 1/2 tablet of oxycodone which she took today and did receive some relief of her symptoms from that.  She is also was prescribed Xanax and took a dose of that today for her anxiety.  Currently she endorses 10/10 right upper quadrant abdominal pain.  She  denies any urinary symptoms or vaginal discharge.      Home Medications Prior to Admission medications   Medication Sig Start Date End Date Taking? Authorizing Provider  acetaminophen (TYLENOL) 500 MG tablet Take 1,000 mg by mouth every 6 (six) hours as needed for mild pain or moderate pain.   Yes [provider]  alprazolam Prudy Feeler(XANAX) 2 MG tablet Take 2 mg by mouth at bedtime as needed for anxiety.  04/30/17  Yes [provider]  etonogestrel (NEXPLANON) 68 MG IMPL implant 1 each by Subdermal route once.   Yes [provider]  ibuprofen (ADVIL) 200 MG tablet Take 200 mg by mouth every 6 (six) hours as needed.   Yes [provider]  oxyCODONE-acetaminophen (PERCOCET) 10-325 MG tablet Take 0.25 tablets by mouth once.   Yes [provider]  oxyCODONE-acetaminophen (PERCOCET) 10-325 MG tablet Take 1 tablet by mouth every 4 (four) hours as needed for pain.    [provider]      Allergies    Levaquin [levofloxacin]    Review of Systems   Review of Systems  Constitutional:  Positive for appetite change, chills and fever.  Respiratory:  Positive for chest tightness.   Cardiovascular:  Positive for chest pain.  Gastrointestinal:  Positive for abdominal pain and nausea.  Musculoskeletal:  Positive for back pain.  Neurological:        Migrating paresthesias  All other systems reviewed and  are negative.   Physical Exam Updated Vital Signs BP 115/62   Pulse 71   Temp 98.4 F (36.9 C)   Resp 15   Ht  (1.575 m)   Wt 72.6 kg   SpO2 99%   BMI 29.26 kg/m  Physical Exam Vitals and nursing note reviewed.  Constitutional:      General: She is not in acute distress.    Appearance: She is well-developed and normal weight. She is not ill-appearing, toxic-appearing or diaphoretic.  HENT:     Head: Normocephalic and atraumatic.  Eyes:     Extraocular Movements: Extraocular movements intact.     Conjunctiva/sclera: Conjunctivae normal.   Neck:     Vascular: No JVD.  Cardiovascular:     Rate and Rhythm: Normal rate and regular rhythm.     Heart sounds: No murmur heard. Pulmonary:     Effort: Pulmonary effort is normal. No respiratory distress.     Breath sounds: Normal breath sounds. No decreased breath sounds, wheezing, rhonchi or rales.  Chest:     Chest wall: No tenderness.  Abdominal:     Palpations: Abdomen is soft.     Tenderness: There is abdominal tenderness.  Musculoskeletal:        General: No swelling.     Cervical back: Normal range of motion and neck supple.     Right lower leg: No edema.     Left lower leg: No edema.  Skin:    General: Skin is warm and dry.     Coloration: Skin is not cyanotic or pale.  Neurological:     General: No focal deficit present.     Mental Status: She is alert and oriented to person, place, and time.     Cranial Nerves: No cranial nerve deficit.  Psychiatric:        Mood and Affect: Mood normal.        Behavior: Behavior normal.     ED Results / Procedures / Treatments   Labs (all labs ordered are listed, but only abnormal results are displayed) Labs Reviewed  COMPREHENSIVE METABOLIC PANEL - Abnormal; Notable for the following components:      Result Value   Glucose, Bld 105 (*)    AST 63 (*)    ALT 78 (*)    All other components within normal limits  CBC - Abnormal; Notable for the following components:   Platelets 145 (*)    All other components within normal limits  URINALYSIS, ROUTINE W REFLEX MICROSCOPIC - Abnormal; Notable for the following components:   Hgb urine dipstick TRACE (*)    Ketones, ur 15 (*)    Protein, ur 30 (*)    All other components within normal limits  URINALYSIS, MICROSCOPIC (REFLEX) - Abnormal; Notable for the following components:   Bacteria, UA FEW (*)    All other components within normal limits  LIPASE, BLOOD  MAGNESIUM  HIV ANTIBODY (ROUTINE TESTING W REFLEX)  COMPREHENSIVE METABOLIC PANEL  CBC  I-STAT BETA HCG BLOOD, ED  ([facility], [facility], [facility] ONLY)  TROPONIN I (HIGH SENSITIVITY)  TROPONIN I (HIGH SENSITIVITY)    EKG EKG Interpretation  Date/Time:  Wednesday July 03 2021 09:49:55 EDT Ventricular Rate:  97 PR Interval:  130 QRS Duration: 68 QT Interval:  336 QTC Calculation: 426 R Axis:   41 Text Interpretation: Normal sinus rhythm Nonspecific T wave abnormality Abnormal ECG When compared with ECG of 27-Nov-2018 23:10, PREVIOUS ECG IS PRESENT Confirmed by Gloris Manchester ([id number]) on  07/03/2021 11:48:23 AM  Radiology US Renal  Result Date: 07/03/2021 CLINICAL DATA:  Lesions identified on CT. EXAM: RENAL / URINARY TRACT ULTRASOUND COMPLETE COMPARISON:  CT abdomen and pelvis with contrast 07/03/2021 FINDINGS: There is decreased resolution due to patient's large body habitus. Right Kidney: Renal measurements: 12.8 x 4.9 x 5.5 cm = volume: 180 mL. Echogenicity within normal limits. No mass or hydronephrosis visualized. Left Kidney: Renal measurements: 11.8 x 5.8 x 6.9 cm = volume: 248 mL. Echogenicity within normal limits. No mass or hydronephrosis visualized. Bladder: Appears normal for degree of bladder distention. Other: None. IMPRESSION: The low-attenuation lesions within the bilateral kidneys (measuring up to 13 mm in the right upper pole 7 mm of the right medial midpole and 3 mm within the far inferior left kidney) are not visualized on the current exam. No suspicious renal lesion is seen. The subcentimeter lesions may be below the sensitivity for ultrasound this patient given patient's large body habitus. Given the right upper pole 13 mm low-density lesion with ill-defined margins within the right kidney seen on CT today, consider a three-month follow-up CT of the abdomen only without and with contrast (renal protocol) for further evaluation of possible enhancement and stability. Alternatively, a three-month follow-up MRI without and with contrast could be performed to assess for cyst versus solid mass. No hydronephrosis within  either kidney. Electronically Signed   By: Neita Garnet M.D.   On: 07/03/2021 15:39   CT ABDOMEN PELVIS W CONTRAST  Result Date: 07/03/2021 CLINICAL DATA:  Abdominal pain, nausea, vomiting EXAM: CT ABDOMEN AND PELVIS WITH CONTRAST TECHNIQUE: Multidetector CT imaging of the abdomen and pelvis was performed using the standard protocol following bolus administration of intravenous contrast. RADIATION DOSE REDUCTION: This exam was performed according to the departmental dose-optimization program which includes automated exposure control, adjustment of the mA and/or kV according to patient size and/or use of iterative reconstruction technique. CONTRAST:  36mL OMNIPAQUE IOHEXOL 350 MG/ML SOLN COMPARISON:  05/30/2014 FINDINGS: Lower chest: Unremarkable. Hepatobiliary: Liver measures 20.9 cm in length. There is no dilation of bile ducts. There are multiple calcified gallbladder stones. Pancreas: No focal abnormality is seen. Spleen: Unremarkable. Adrenals/Urinary Tract: Adrenals are unremarkable. There is no hydronephrosis. There is 1.3 cm low-density lesion with ill-defined margins in the upper pole of right kidney. Density measurements are higher than usual for simple cyst. There is another ill-defined 7 mm low-density in the medial midportion of right kidney. There is 3 mm low-density in the lower pole left kidney. 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 There are no renal or ureteral stones. Urinary bladder is not distended. Stomach/Bowel: Small hiatal hernia is seen. Small bowel loops are not dilated. Appendix is not seen. There is no pericecal inflammation. There is no significant wall thickening in colon. There is no pericolic stranding. Few diverticula are seen in the colon. Vascular/Lymphatic: Unremarkable. Reproductive: There is 2.3 cm low-density structure in the left adnexa, possibly follicle. Other: There is no ascites or pneumoperitoneum. Small umbilical hernia containing fat is seen.  Musculoskeletal: Unremarkable. IMPRESSION: There is no evidence of intestinal obstruction or pneumoperitoneum. There is no hydronephrosis. There are few low-density lesions in the renal cortex in both kidneys. Density measurements are higher than usual for simple cysts. Follow-up renal sonogram may be considered. Gallbladder stones.  Small hiatal hernia. Other findings as described in the body of the report. Electronically Signed   By: Ernie Avena M.D.   On: 07/03/2021 13:52   CT Angio Chest PE W  and/or Wo Contrast  Result Date: 07/03/2021 CLINICAL DATA:  Chest pain EXAM: CT ANGIOGRAPHY CHEST WITH CONTRAST TECHNIQUE: Multidetector CT imaging of the chest was performed using the standard protocol during bolus administration of intravenous contrast. Multiplanar CT image reconstructions and MIPs were obtained to evaluate the vascular anatomy. RADIATION DOSE REDUCTION: This exam was performed according to the departmental dose-optimization program which includes automated exposure control, adjustment of the mA and/or kV according to patient size and/or use of iterative reconstruction technique. CONTRAST:  80mL OMNIPAQUE IOHEXOL 350 MG/ML SOLN COMPARISON:  04/27/2017 FINDINGS: Cardiovascular: There is homogeneous enhancement in thoracic aorta. There is ectasia of ascending thoracic aorta measuring 3.6 cm. There is mild ectasia of main pulmonary artery measuring 3.1 cm. There are no intraluminal filling defects in the pulmonary artery branches. Mediastinum/Nodes: No significant lymphadenopathy is seen. Beam hardening artifacts caused by patient's necklace limits evaluation of lower neck. Lungs/Pleura: There is no focal pulmonary consolidation. There is no pleural effusion or pneumothorax. Upper Abdomen: Gallbladder stones are seen. Small hiatal hernia is seen. Musculoskeletal: Unremarkable. Augmentation prostheses are seen in both breasts. Review of the MIP images confirms the above findings. IMPRESSION: There  is no evidence of thoracic aortic dissection. There is no evidence of pulmonary artery embolism. There is no focal pulmonary consolidation. Gallbladder stones. Small hiatal hernia. There is ectasia of ascending thoracic aorta measuring 3.6 cm. Electronically Signed   By: Ernie Avena M.D.   On: 07/03/2021 13:33   US Abdomen Limited RUQ (LIVER/GB)  Result Date: 07/03/2021 CLINICAL DATA:  41 year old female with 4 days of right upper quadrant pain. EXAM: ULTRASOUND ABDOMEN LIMITED RIGHT UPPER QUADRANT COMPARISON:  Abdomen ultrasound 01/22/2014. FINDINGS: Gallbladder: Shadowing gallstones, individually up to 11 mm (image 5). Wall echo shadow (WES sign) appearance on some images (image 6). But no sonographic Murphy sign elicited, and gallbladder wall thickness appears to remain normal. No pericholecystic fluid identified. Common bile duct: Diameter: 3 mm, normal. Liver: No focal lesion identified. Within normal limits in parenchymal echogenicity. Portal vein is patent on color Doppler imaging with normal direction of blood flow towards the liver. Other: Negative visible right kidney. IMPRESSION: 1. Chronic cholelithiasis, with a Wall-echo-shadow (WES sign) appearance suggesting a gallbladder contracted around numerous stones. But no wall thickening or sonographic Murphy sign elicited to suggest acute cholecystitis. 2. No evidence of bile duct obstruction. Electronically Signed   By: Odessa Fleming M.D.   On: 07/03/2021 11:07    Procedures Procedures    Medications Ordered in ED Medications  enoxaparin (LOVENOX) injection 40 mg (has no administration in time range)  metoprolol tartrate (LOPRESSOR) injection 5 mg (has no administration in time range)  pantoprazole (PROTONIX) injection 40 mg (has no administration in time range)  simethicone (MYLICON) chewable tablet 40 mg (has no administration in time range)  ondansetron (ZOFRAN-ODT) disintegrating tablet 4 mg (has no administration in time range)    Or   ondansetron (ZOFRAN) injection 4 mg (has no administration in time range)  prochlorperazine (COMPAZINE) tablet 10 mg (has no administration in time range)    Or  prochlorperazine (COMPAZINE) injection 5-10 mg (has no administration in time range)  polyethylene glycol (MIRALAX / GLYCOLAX) packet 17 g (has no administration in time range)  docusate sodium (COLACE) capsule 100 mg (has no administration in time range)  diphenhydrAMINE (BENADRYL) capsule 25 mg (has no administration in time range)    Or  diphenhydrAMINE (BENADRYL) injection 25 mg (has no administration in time range)  methocarbamol (ROBAXIN) tablet 500 mg (  has no administration in time range)    Or  methocarbamol (ROBAXIN) 500 mg in dextrose 5 % 50 mL IVPB (has no administration in time range)  morphine (PF) 2 MG/ML injection 2 mg (has no administration in time range)  oxyCODONE (Oxy IR/ROXICODONE) immediate release tablet 5-10 mg (has no administration in time range)  acetaminophen (TYLENOL) tablet 650 mg (has no administration in time range)    Or  acetaminophen (TYLENOL) suppository 650 mg (has no administration in time range)  0.9 %  sodium chloride infusion (has no administration in time range)  cefTRIAXone (ROCEPHIN) 2 g in sodium chloride 0.9 % 100 mL IVPB (has no administration in time range)  ALPRAZolam (XANAX) tablet 2 mg (has no administration in time range)  lactated ringers bolus 1,000 mL (1,000 mLs Intravenous New Bag/Given 07/03/21 1228)  oxyCODONE-acetaminophen (PERCOCET/ROXICET) 5-325 MG per tablet 2 tablet (2 tablets Oral Given 07/03/21 1230)  ondansetron (ZOFRAN-ODT) disintegrating tablet 4 mg (4 mg Oral Given 07/03/21 1228)  iohexol (OMNIPAQUE) 350 MG/ML injection 80 mL (80 mLs Intravenous Contrast Given 07/03/21 1321)  morphine (PF) 4 MG/ML injection 4 mg (4 mg Intravenous Given 07/03/21 1440)    ED Course/ Medical Decision Making/ A&P                           Medical Decision Making Amount and/or  Complexity of Data Reviewed Radiology: ordered.  Risk Prescription drug management. Decision regarding hospitalization.   This patient presents to the ED for concern of right upper quadrant abdominal pain, this involves an extensive number of treatment options, and is a complaint that carries with it a high risk of complications and morbidity.  The differential diagnosis includes cholecystitis, symptomatic cholelithiasis, hepatitis, pancreatitis, PUD, Fitz-Hugh Curtis syndrome, appendicitis, constipation, lower lobe pneumonia, PE   Co morbidities that complicate the patient evaluation  Cholelithiasis, migraine headaches, nephrolithiasis, PE   Additional history obtained:  Additional history obtained from N/A External records from outside source obtained and reviewed including EMR   Lab Tests:  I Ordered, and personally interpreted labs.  The pertinent results include: No lupus ptosis, mild elevation in transaminases, normal bilirubin, normal electrolytes normal troponin   Imaging Studies ordered:  I ordered imaging studies including CTA chest, CT of abdomen and pelvis, ultrasound imaging of right upper quadrant and kidneys I independently visualized and interpreted imaging which showed gallstones are present.  There is no gallbladder wall thickening.  There is no evidence of PE or pneumonia.  There were findings of low-density lesions in bilateral renal cortices on CT scan; these were not identified on renal ultrasound. I agree with the radiologist interpretation   Cardiac Monitoring: / EKG:  The patient was maintained on a cardiac monitor.  I personally viewed and interpreted the cardiac monitored which showed an underlying rhythm of: Sinus rhythm   Consultations Obtained:  I requested consultation with general surgery,  and discussed lab and imaging findings as well as pertinent plan - they recommend: Admission to their service with planned cholecystectomy in the  morning   Problem List / ED Course / Critical interventions / Medication management  Patient is a 41 year old female presenting for 3 days of right upper quadrant abdominal pain.  She has had additional symptoms including fevers, nausea, chest tightness and migrating paresthesias.  On arrival in the ED, she is afebrile.  Vital signs are normal and she is well-appearing on exam.  She has no focal neurologic deficits.  I do  not suspect that the paresthesias are neurologic in origin.  Patient's abdomen is soft but she does have tenderness in the right upper quadrant.  She denies any urinary symptoms and does not have any CVA tenderness.  Her lungs are clear to auscultation her breathing is unlabored.  Patient underwent thorough work-up given the multitude of symptoms that she arrives with.  Work-up is notable for cholelithiasis without evidence of cholecystitis on imaging.  Patient's transaminases are mildly elevated.  Lab work is otherwise reassuring.  I consulted general surgery for symptomatic cholelithiasis.  They evaluated the patient in the ED and admitted her to their service for planned cholecystectomy tomorrow morning. I ordered medication including Zofran for nausea; Percocet and morphine for analgesia; IV fluids for hydration Reevaluation of the patient after these medicines showed that the patient improved I have reviewed the patients home medicines and have made adjustments as needed   Social Determinants of Health:  Lives at home with family         Final Clinical Impression(s) / ED Diagnoses Final diagnoses:  Symptomatic cholelithiasis    Rx / DC Orders ED Discharge Orders     None         Gloris Manchester, MD 07/03/21 1726

## 2021-07-03 NOTE — ED Notes (Signed)
Patient ambulated to restroom independently and returned to room, patient remained steady and walking with no assistance.

## 2021-07-03 NOTE — ED Provider Triage Note (Signed)
Emergency Medicine Provider Triage Evaluation Note  Caley M Kanaan , a 41 y.o. female  was evaluated in triage.  Pt complains of RUQ abdominal pain, centralized chest pain, and paresthesias for 3 days. Reported fever of over 102 F. Prior hx of gallstones.   Review of Systems  Positive: As above, diarrhea Negative: SOB, vomiting  Physical Exam  BP 132/75 (BP Location: Right Arm)   Pulse (!) 103   Temp 98.4 F (36.9 C)   Resp 18   Ht 5\' 2"  (1.575 m)   Wt 72.6 kg   SpO2 100%   BMI 29.26 kg/m  Gen:   Awake, no distress   Resp:  Normal effort  MSK:   Moves extremities without difficulty  Other:  RUQ TTP, no guarding, abdomen soft  Medical Decision Making  Medically screening exam initiated at 10:11 AM.  Appropriate orders placed.  Marilena M Yamashiro was informed that the remainder of the evaluation will be completed by another provider, this initial triage assessment does not replace that evaluation, and the importance of remaining in the ED until their evaluation is complete.  Will start with basic labs, electrolytes, and RUQ ultrasound. Doubt cardiac etiology at this time.   Jakaiden Fill T, PA-C 07/03/21 1012

## 2021-07-04 ENCOUNTER — Encounter (HOSPITAL_COMMUNITY)
Admission: EM | Disposition: A | Discharge status: Home / Self Care | Payer: Self-pay | Source: Home / Self Care | Attending: Emergency Medicine

## 2021-07-04 ENCOUNTER — Observation Stay (HOSPITAL_BASED_OUTPATIENT_CLINIC_OR_DEPARTMENT_OTHER): Payer: Medicaid Other | Admitting: Anesthesiology

## 2021-07-04 ENCOUNTER — Other Ambulatory Visit: Payer: Self-pay

## 2021-07-04 ENCOUNTER — Observation Stay (HOSPITAL_COMMUNITY): Payer: Medicaid Other | Admitting: Anesthesiology

## 2021-07-04 ENCOUNTER — Encounter (HOSPITAL_COMMUNITY): Payer: Self-pay

## 2021-07-04 DIAGNOSIS — K8020 Calculus of gallbladder without cholecystitis without obstruction: Secondary | ICD-10-CM

## 2021-07-04 HISTORY — PX: CHOLECYSTECTOMY: SHX55

## 2021-07-04 LAB — CBC
HCT: 35.8 % — ABNORMAL LOW (ref 36.0–46.0)
Hemoglobin: 12.1 g/dL (ref 12.0–15.0)
MCH: 32.3 pg (ref 26.0–34.0)
MCHC: 33.8 g/dL (ref 30.0–36.0)
MCV: 95.5 fL (ref 80.0–100.0)
Platelets: 119 10*3/uL — ABNORMAL LOW (ref 150–400)
RBC: 3.75 MIL/uL — ABNORMAL LOW (ref 3.87–5.11)
RDW: 12.2 % (ref 11.5–15.5)
WBC: 6.6 10*3/uL (ref 4.0–10.5)
nRBC: 0 % (ref 0.0–0.2)

## 2021-07-04 LAB — COMPREHENSIVE METABOLIC PANEL
ALT: 54 U/L — ABNORMAL HIGH (ref 0–44)
AST: 36 U/L (ref 15–41)
Albumin: 3 g/dL — ABNORMAL LOW (ref 3.5–5.0)
Alkaline Phosphatase: 63 U/L (ref 38–126)
Anion gap: 10 (ref 5–15)
BUN: 7 mg/dL (ref 6–20)
CO2: 21 mmol/L — ABNORMAL LOW (ref 22–32)
Calcium: 8.3 mg/dL — ABNORMAL LOW (ref 8.9–10.3)
Chloride: 104 mmol/L (ref 98–111)
Creatinine, Ser: 0.6 mg/dL (ref 0.44–1.00)
GFR, Estimated: >60 mL/min (ref >60)
Glucose, Bld: 86 mg/dL (ref 70–99)
Potassium: 3.9 mmol/L (ref 3.5–5.1)
Sodium: 135 mmol/L (ref 135–145)
Total Bilirubin: 1 mg/dL (ref 0.3–1.2)
Total Protein: 5.7 g/dL — ABNORMAL LOW (ref 6.5–8.1)

## 2021-07-04 SURGERY — LAPAROSCOPIC CHOLECYSTECTOMY WITH INTRAOPERATIVE CHOLANGIOGRAM
Anesthesia: General | Site: Abdomen

## 2021-07-04 MED ORDER — HYDROMORPHONE HCL 1 MG/ML IJ SOLN
INTRAMUSCULAR | Status: AC
  Filled 2021-07-04: qty 1

## 2021-07-04 MED ORDER — ORAL CARE MOUTH RINSE: 15.0000 mL | Freq: Once | OROMUCOSAL | Status: AC

## 2021-07-04 MED ORDER — MIDAZOLAM HCL 2 MG/2ML IJ SOLN: 0.5000 mg | Freq: Once | INTRAMUSCULAR | Status: DC | PRN

## 2021-07-04 MED ORDER — KETOROLAC TROMETHAMINE 30 MG/ML IJ SOLN
INTRAMUSCULAR | Status: AC
  Filled 2021-07-04: qty 1

## 2021-07-04 MED ORDER — PROPOFOL 10 MG/ML IV BOLUS
INTRAVENOUS | Status: AC
  Filled 2021-07-04: qty 20

## 2021-07-04 MED ORDER — MIDAZOLAM HCL 2 MG/2ML IJ SOLN
INTRAMUSCULAR | Status: AC
  Filled 2021-07-04: qty 2

## 2021-07-04 MED ORDER — PROPOFOL 10 MG/ML IV BOLUS
INTRAVENOUS | Status: DC | PRN
  Administered 2021-07-04: 200 mg via INTRAVENOUS

## 2021-07-04 MED ORDER — PHENYLEPHRINE 80 MCG/ML (10ML) SYRINGE FOR IV PUSH (FOR BLOOD PRESSURE SUPPORT)
PREFILLED_SYRINGE | INTRAVENOUS | Status: DC | PRN
  Administered 2021-07-04: 80 ug via INTRAVENOUS

## 2021-07-04 MED ORDER — ONDANSETRON HCL 4 MG/2ML IJ SOLN
INTRAMUSCULAR | Status: DC | PRN
  Administered 2021-07-04: 4 mg via INTRAVENOUS

## 2021-07-04 MED ORDER — SUGAMMADEX SODIUM 200 MG/2ML IV SOLN
INTRAVENOUS | Status: DC | PRN
  Administered 2021-07-04: 200 mg via INTRAVENOUS

## 2021-07-04 MED ORDER — KETOROLAC TROMETHAMINE 30 MG/ML IJ SOLN
INTRAMUSCULAR | Status: DC | PRN
  Administered 2021-07-04: 30 mg via INTRAVENOUS

## 2021-07-04 MED ORDER — FENTANYL CITRATE (PF) 250 MCG/5ML IJ SOLN
INTRAMUSCULAR | Status: AC
  Filled 2021-07-04: qty 5

## 2021-07-04 MED ORDER — LACTATED RINGERS IV SOLN: INTRAVENOUS | Status: DC

## 2021-07-04 MED ORDER — MIDAZOLAM HCL 2 MG/2ML IJ SOLN
INTRAMUSCULAR | Status: DC | PRN
  Administered 2021-07-04 (×2): 1 mg via INTRAVENOUS

## 2021-07-04 MED ORDER — PROMETHAZINE HCL 25 MG/ML IJ SOLN: 6.2500 mg | INTRAMUSCULAR | Status: DC | PRN

## 2021-07-04 MED ORDER — OXYCODONE HCL 5 MG PO TABS: 5.0000 mg | ORAL_TABLET | ORAL | 0 refills | Status: DC | PRN

## 2021-07-04 MED ORDER — SODIUM CHLORIDE 0.9 % IR SOLN
Status: DC | PRN
  Administered 2021-07-04: 1000 mL

## 2021-07-04 MED ORDER — SCOPOLAMINE 1 MG/3DAYS TD PT72
MEDICATED_PATCH | TRANSDERMAL | Status: AC
  Filled 2021-07-04: qty 1

## 2021-07-04 MED ORDER — OXYCODONE HCL 5 MG PO TABS
ORAL_TABLET | ORAL | Status: AC
  Filled 2021-07-04: qty 1

## 2021-07-04 MED ORDER — CHLORHEXIDINE GLUCONATE 0.12 % MT SOLN
OROMUCOSAL | Status: AC
  Administered 2021-07-04: 15 mL via OROMUCOSAL
  Filled 2021-07-04: qty 15

## 2021-07-04 MED ORDER — LIDOCAINE 2% (20 MG/ML) 5 ML SYRINGE
INTRAMUSCULAR | Status: DC | PRN
  Administered 2021-07-04: 30 mg via INTRAVENOUS

## 2021-07-04 MED ORDER — DEXAMETHASONE SODIUM PHOSPHATE 10 MG/ML IJ SOLN
INTRAMUSCULAR | Status: AC
  Filled 2021-07-04: qty 1

## 2021-07-04 MED ORDER — ACETAMINOPHEN 10 MG/ML IV SOLN
INTRAVENOUS | Status: AC
  Filled 2021-07-04: qty 100

## 2021-07-04 MED ORDER — OXYCODONE HCL 5 MG/5ML PO SOLN: 5.0000 mg | Freq: Once | ORAL | Status: DC | PRN

## 2021-07-04 MED ORDER — DEXAMETHASONE SODIUM PHOSPHATE 10 MG/ML IJ SOLN
INTRAMUSCULAR | Status: DC | PRN
  Administered 2021-07-04: 10 mg via INTRAVENOUS

## 2021-07-04 MED ORDER — BUPIVACAINE-EPINEPHRINE 0.25% -1:200000 IJ SOLN
INTRAMUSCULAR | Status: DC | PRN
  Administered 2021-07-04: 30 mL

## 2021-07-04 MED ORDER — PHENYLEPHRINE 80 MCG/ML (10ML) SYRINGE FOR IV PUSH (FOR BLOOD PRESSURE SUPPORT)
PREFILLED_SYRINGE | INTRAVENOUS | Status: AC
  Filled 2021-07-04: qty 10

## 2021-07-04 MED ORDER — HYDROMORPHONE HCL 1 MG/ML IJ SOLN
0.2500 mg | INTRAMUSCULAR | Status: DC | PRN
  Administered 2021-07-04 (×3): 0.5 mg via INTRAVENOUS

## 2021-07-04 MED ORDER — INDOCYANINE GREEN 25 MG IV SOLR
INTRAVENOUS | Status: DC | PRN
  Administered 2021-07-04: 1 mg via INTRAVENOUS

## 2021-07-04 MED ORDER — OXYCODONE HCL 5 MG PO TABS: 5.0000 mg | ORAL_TABLET | Freq: Once | ORAL | Status: DC | PRN

## 2021-07-04 MED ORDER — LIDOCAINE 2% (20 MG/ML) 5 ML SYRINGE
INTRAMUSCULAR | Status: AC
  Filled 2021-07-04: qty 5

## 2021-07-04 MED ORDER — ACETAMINOPHEN 10 MG/ML IV SOLN
INTRAVENOUS | Status: DC | PRN
  Administered 2021-07-04: 1000 mg via INTRAVENOUS

## 2021-07-04 MED ORDER — ONDANSETRON HCL 4 MG/2ML IJ SOLN
INTRAMUSCULAR | Status: AC
  Filled 2021-07-04: qty 2

## 2021-07-04 MED ORDER — 0.9 % SODIUM CHLORIDE (POUR BTL) OPTIME
TOPICAL | Status: DC | PRN
  Administered 2021-07-04: 1000 mL

## 2021-07-04 MED ORDER — FENTANYL CITRATE (PF) 250 MCG/5ML IJ SOLN
INTRAMUSCULAR | Status: DC | PRN
  Administered 2021-07-04: 100 ug via INTRAVENOUS
  Administered 2021-07-04 (×6): 50 ug via INTRAVENOUS

## 2021-07-04 MED ORDER — HYDROMORPHONE HCL 1 MG/ML IJ SOLN
0.2500 mg | INTRAMUSCULAR | Status: DC | PRN
  Administered 2021-07-04: 0.25 mg via INTRAVENOUS
  Administered 2021-07-04 (×3): 0.5 mg via INTRAVENOUS
  Administered 2021-07-04: 0.25 mg via INTRAVENOUS

## 2021-07-04 MED ORDER — BUPIVACAINE-EPINEPHRINE (PF) 0.25% -1:200000 IJ SOLN
INTRAMUSCULAR | Status: AC
  Filled 2021-07-04: qty 30

## 2021-07-04 MED ORDER — OXYCODONE HCL 5 MG PO TABS
5.0000 mg | ORAL_TABLET | Freq: Once | ORAL | Status: AC
  Administered 2021-07-04: 5 mg via ORAL

## 2021-07-04 MED ORDER — ROCURONIUM BROMIDE 10 MG/ML (PF) SYRINGE
PREFILLED_SYRINGE | INTRAVENOUS | Status: DC | PRN
  Administered 2021-07-04: 60 mg via INTRAVENOUS

## 2021-07-04 MED ORDER — CHLORHEXIDINE GLUCONATE 0.12 % MT SOLN: 15.0000 mL | Freq: Once | OROMUCOSAL | Status: AC

## 2021-07-04 MED ORDER — ROCURONIUM BROMIDE 10 MG/ML (PF) SYRINGE
PREFILLED_SYRINGE | INTRAVENOUS | Status: AC
  Filled 2021-07-04: qty 10

## 2021-07-04 MED ORDER — SCOPOLAMINE 1 MG/3DAYS TD PT72
1.0000 | MEDICATED_PATCH | TRANSDERMAL | Status: DC
  Administered 2021-07-04: 1 via TRANSDERMAL

## 2021-07-04 MED ORDER — MEPERIDINE HCL 25 MG/ML IJ SOLN: 6.2500 mg | INTRAMUSCULAR | Status: DC | PRN

## 2021-07-04 SURGICAL SUPPLY — 52 items
APPLIER CLIP 5 13 M/L LIGAMAX5 (MISCELLANEOUS) ×2
BAG COUNTER SPONGE SURGICOUNT (BAG) ×2 IMPLANT
BLADE CLIPPER SURG (BLADE) IMPLANT
BLADE SURG 11 STRL SS (BLADE) ×1 IMPLANT
CANISTER SUCT 3000ML PPV (MISCELLANEOUS) ×2 IMPLANT
CHLORAPREP W/TINT 26 (MISCELLANEOUS) ×2 IMPLANT
CLIP APPLIE 5 13 M/L LIGAMAX5 (MISCELLANEOUS) ×1 IMPLANT
COVER MAYO STAND STRL (DRAPES) ×1 IMPLANT
COVER SURGICAL LIGHT HANDLE (MISCELLANEOUS) ×2 IMPLANT
DERMABOND ADVANCED (GAUZE/BANDAGES/DRESSINGS) ×1
DERMABOND ADVANCED .7 DNX12 (GAUZE/BANDAGES/DRESSINGS) IMPLANT
DRSG TEGADERM 2-3/8X2-3/4 SM (GAUZE/BANDAGES/DRESSINGS) ×6 IMPLANT
DRSG TEGADERM 4X4.75 (GAUZE/BANDAGES/DRESSINGS) ×2 IMPLANT
ELECT REM PT RETURN 9FT ADLT (ELECTROSURGICAL) ×2
ELECTRODE REM PT RTRN 9FT ADLT (ELECTROSURGICAL) ×1 IMPLANT
GAUZE 4X4 16PLY ~~LOC~~+RFID DBL (SPONGE) ×1 IMPLANT
GAUZE SPONGE 2X2 8PLY STRL LF (GAUZE/BANDAGES/DRESSINGS) ×1 IMPLANT
GLOVE BIOGEL M STRL SZ7.5 (GLOVE) ×2 IMPLANT
GLOVE INDICATOR 8.0 STRL GRN (GLOVE) ×4 IMPLANT
GOWN STRL REUS W/ TWL LRG LVL3 (GOWN DISPOSABLE) ×3 IMPLANT
GOWN STRL REUS W/TWL 2XL LVL3 (GOWN DISPOSABLE) ×2 IMPLANT
GOWN STRL REUS W/TWL LRG LVL3 (GOWN DISPOSABLE) ×3
GRASPER SUT TROCAR 14GX15 (MISCELLANEOUS) ×2 IMPLANT
KIT BASIN OR (CUSTOM PROCEDURE TRAY) ×2 IMPLANT
KIT TURNOVER KIT B (KITS) ×2 IMPLANT
NS IRRIG 1000ML POUR BTL (IV SOLUTION) ×2 IMPLANT
PAD ARMBOARD 7.5X6 YLW CONV (MISCELLANEOUS) ×2 IMPLANT
POUCH RETRIEVAL ECOSAC 10 (ENDOMECHANICALS) ×1 IMPLANT
POUCH RETRIEVAL ECOSAC 10MM (ENDOMECHANICALS) ×1
SCISSORS LAP 5X35 DISP (ENDOMECHANICALS) ×2 IMPLANT
SET IRRIG TUBING LAPAROSCOPIC (IRRIGATION / IRRIGATOR) ×2 IMPLANT
SET TUBE SMOKE EVAC HIGH FLOW (TUBING) ×2 IMPLANT
SLEEVE ENDOPATH XCEL 5M (ENDOMECHANICALS) ×2 IMPLANT
SLEEVE Z-THREAD 5X100MM (TROCAR) ×2 IMPLANT
SOL ANTI FOG 6CC (MISCELLANEOUS) IMPLANT
SOLUTION ANTI FOG 6CC (MISCELLANEOUS) ×1
SPECIMEN JAR SMALL (MISCELLANEOUS) ×2 IMPLANT
SPONGE GAUZE 2X2 STER 10/PKG (GAUZE/BANDAGES/DRESSINGS) ×1
STRIP CLOSURE SKIN 1/2X4 (GAUZE/BANDAGES/DRESSINGS) ×2 IMPLANT
SUT MNCRL AB 4-0 PS2 18 (SUTURE) ×2 IMPLANT
SUT VIC AB 0 UR5 27 (SUTURE) ×1 IMPLANT
SUT VIC AB 2-0 UR6 27 (SUTURE) ×1 IMPLANT
SUT VICRYL 0 UR6 27IN ABS (SUTURE) IMPLANT
SYR CONTROL 10ML LL (SYRINGE) ×1 IMPLANT
TOWEL GREEN STERILE (TOWEL DISPOSABLE) ×2 IMPLANT
TOWEL GREEN STERILE FF (TOWEL DISPOSABLE) ×2 IMPLANT
TRAY LAPAROSCOPIC MC (CUSTOM PROCEDURE TRAY) ×2 IMPLANT
TROCAR XCEL BLUNT TIP 100MML (ENDOMECHANICALS) ×2 IMPLANT
TROCAR XCEL NON-BLD 5MMX100MML (ENDOMECHANICALS) ×2 IMPLANT
TROCAR Z-THREAD OPTICAL 5X100M (TROCAR) ×1 IMPLANT
WARMER LAPAROSCOPE (MISCELLANEOUS) ×1 IMPLANT
WATER STERILE IRR 1000ML POUR (IV SOLUTION) ×2 IMPLANT

## 2021-07-04 NOTE — Discharge Summary (Signed)
Patient ID: Casey Barry [id number] 01-03-1981 41 y.o.  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 States her sxs started 3 days ago. RUQ pain is constant, severe, worse with PO intake and radiates to her back. Pain not relieved by tylenol/oxycodone. Associated with nausea.  Reports she occasionally vomits at baseline in the mornings which she has continued to do but has not had any increase episodes of vomiting.  Notes fever to 102 for the first 2 days of symptoms.  No fever today or in the ED. Denies urinary symptoms or vaginal discharge. Did have some diarrhea a few days ago which has resolved over the last 48 hours. She has been seen for similar pains in the past and was diagnosed with gallstones but never followed up with surgery.    Patient also in endorses diffuse chest tightness with intermittent stabbing pains and shortness of breath.  It is unclear if the pain is radiating from her abdomen or separate.  Notes she will occasionally have chest tightness and shortness of breath during anxiety/panic attacks but feels this feels different.  She has been under a lot of stress with her child recently being deployed in [employer], a child graduating and her also losing her job.  Her symptoms are nonexertional.  She denies history of prior heart attacks or history of CAD.  Denies any tobacco use.  She also reports intermittent paresthesias of her face and left fingertips.  The facial paresthesias  began 3 days ago and have resolved.  She reports the intermittent paresthesias of her left fingertips have been present for the last several months.  No visual changes, vertigo or weakness.   Patient received extensive work-up in the ED.  She was afebrile, without hypotension or hypoxia. Initially tachycardic which has resolved after IV fluids.  WBC 9.9.  Lipase wnl. AST 63, ALT 78, Alk Phos 71, T. Bili 1.2. Urine preg negative. Tn x 1 neg. Repeat pending. EKG. UA without obvious UTI (Leuk and nitrate neg, few bacteria, 0-5 wbc, not clean catch with 11-20 squamous cells). CTA chest negative for PE or dissection. No other acute cardiothoracic process. CT A/P w gallstones, a hiatal hernia and a few low density lesions in the renal cortex of both kidneys. RUQ [place] with chronic cholelithiasis with numerus stones, contracted GB and no gallbladder wall thickness or pericholecystic fluid. CBD 46m. Pain has improved since arrival after IV pain medication but although improved, is persistent. We were asked to see.    No prior abdominal surgeries Denies current use of blood thinners.  Denies tobacco, alcohol, or drug use. Is prescribed oxycodone for her chronic pain.   Procedures Lap chole, Dr. WRedmond Pulling6/15  Hospital Course:  The patient was admitted and underwent a laparoscopic cholecystectomy.  The patient tolerated the procedure well.  On POD 0, the patient was tolerating a diet, voiding well, mobilizing, and pain was controlled with oral pain medications.  The patient was stable for DC home at this time with appropriate follow up made from the PACU.  Allergies as of 07/04/2021       Reactions   Levaquin [levofloxacin] Anaphylaxis        Medication List     TAKE these medications    acetaminophen 500 MG tablet Commonly known as: TYLENOL Take 1,000 mg by mouth every 6 (six) hours as needed for mild pain or moderate pain.   alprazolam 2 MG tablet Commonly known as: XANAX Take 2 mg by mouth at  bedtime as needed for anxiety.   ibuprofen 200 MG tablet Commonly known as: ADVIL Take 200 mg by mouth every 6 (six) hours as needed.   Nexplanon 68 MG Impl implant Generic drug: etonogestrel 1 each by Subdermal route once.   oxyCODONE 5 MG immediate release tablet Commonly known as: Oxy IR/ROXICODONE Take 1-2 tablets (5-10 mg total) by mouth every 4 (four) hours as needed for moderate pain.   oxyCODONE-acetaminophen 10-325 MG tablet Commonly known as: PERCOCET Take 1 tablet by mouth every 4 (four) hours as needed for pain. What changed: Another medication with the same name was removed. Continue taking this medication, and follow the directions you see here.          Follow-up Information     Maczis, Carlena Hurl, PA-C Follow up in 3 week(s).   Specialty: General Surgery Why: Office will call you with a follow up appointment, If you don't hear from the office, please call Contact information: [place] [address] [phone]                 Signed: Saverio Danker, [facility] Surgery 07/04/2021, 3:33 PM Please see Amion for pager number during day hours 7:00am-4:30pm, 7-11:30am on Weekends

## 2021-07-04 NOTE — Anesthesia Preprocedure Evaluation (Addendum)
Anesthesia Evaluation  Patient identified by MRN, date of birth, ID band Patient awake    Reviewed: Allergy & Precautions, NPO status , Patient's Chart, lab work & pertinent test results  History of Anesthesia Complications Negative for: history of anesthetic complications  Airway Mallampati: II  TM Distance: >3 FB Neck ROM: Full    Dental  (+) Dental Advisory Given   Pulmonary PE (2018, no longer on blood thinners)   breath sounds clear to auscultation       Cardiovascular (-) anginanegative cardio ROS   Rhythm:Regular Rate:Normal     Neuro/Psych  Headaches,    GI/Hepatic Elevated LFTs with acute chole Acute chole   Endo/Other  negative endocrine ROS  Renal/GU H/o stones     Musculoskeletal   Abdominal   Peds  Hematology negative hematology ROS (+)   Anesthesia Other Findings   Reproductive/Obstetrics nexplanon                            Anesthesia Physical Anesthesia Plan  ASA: 2  Anesthesia Plan: General   Post-op Pain Management: Toradol IV (intra-op)*   Induction: Intravenous and Rapid sequence  PONV Risk Score and Plan: 3 and Ondansetron, Dexamethasone and Scopolamine patch - Pre-op  Airway Management Planned: Oral ETT  Additional Equipment: None  Intra-op Plan:   Post-operative Plan: Extubation in OR  Informed Consent: I have reviewed the patients History and Physical, chart, labs and discussed the procedure including the risks, benefits and alternatives for the proposed anesthesia with the patient or authorized representative who has indicated his/her understanding and acceptance.     Dental advisory given  Plan Discussed with: CRNA and Surgeon  Anesthesia Plan Comments:        Anesthesia Quick Evaluation

## 2021-07-04 NOTE — Anesthesia Procedure Notes (Signed)
Procedure Name: Intubation Date/Time: 07/04/2021 9:44 AM  Performed by: Marena Chancy, CRNAPre-anesthesia Checklist: Patient identified, Emergency Drugs available, Suction available and Patient being monitored Patient Re-evaluated:Patient Re-evaluated prior to induction Oxygen Delivery Method: Circle System Utilized Preoxygenation: Pre-oxygenation with 100% oxygen Induction Type: IV induction Ventilation: Mask ventilation without difficulty Laryngoscope Size: Miller and 2 Grade View: Grade I Tube type: Oral Tube size: 7.0 mm Number of attempts: 1 Airway Equipment and Method: Stylet and Oral airway Placement Confirmation: ETT inserted through vocal cords under direct vision, positive ETCO2 and breath sounds checked- equal and bilateral Tube secured with: Tape Dental Injury: Teeth and Oropharynx as per pre-operative assessment

## 2021-07-04 NOTE — Transfer of Care (Signed)
Immediate Anesthesia Transfer of Care Note  Patient: Casey Barry  Procedure(s) Performed: LAPAROSCOPIC CHOLECYSTECTOMY WITH ICG DYE (Abdomen)  Patient Location: PACU  Anesthesia Type:General  Level of Consciousness: awake, alert  and oriented  Airway & Oxygen Therapy: Patient Spontanous Breathing and Patient connected to nasal cannula oxygen  Post-op Assessment: Report given to RN, Post -op Vital signs reviewed and stable and Patient moving all extremities X 4  Post vital signs: Reviewed and stable  Last Vitals:  Vitals Value Taken Time  BP 121/72 07/04/21 1101  Temp    Pulse 89 07/04/21 1104  Resp 14 07/04/21 1104  SpO2 98 % 07/04/21 1104  Vitals shown include unvalidated device data.  Last Pain:  Vitals:   07/04/21 0850  TempSrc: Oral  PainSc: 6          Complications: No notable events documented.

## 2021-07-04 NOTE — Anesthesia Postprocedure Evaluation (Signed)
Anesthesia Post Note  Patient: Huntleigh M Cuddeback  Procedure(s) Performed: LAPAROSCOPIC CHOLECYSTECTOMY WITH ICG DYE (Abdomen)     Patient location during evaluation: PACU Anesthesia Type: General Level of consciousness: awake and alert, patient cooperative and oriented Pain management: pain level controlled (pt starting to improve) Vital Signs Assessment: post-procedure vital signs reviewed and stable Respiratory status: spontaneous breathing, nonlabored ventilation and respiratory function stable Cardiovascular status: blood pressure returned to baseline and stable Postop Assessment: no apparent nausea or vomiting and adequate PO intake Anesthetic complications: no   No notable events documented.  Last Vitals:  Vitals:   07/04/21 1330 07/04/21 1345  BP: 94/71 107/79  Pulse: 77 87  Resp: 18 14  Temp:  36.4 C  SpO2: 96% 93%    Last Pain:  Vitals:   07/04/21 1345  TempSrc:   PainSc: 9                  Davanee Klinkner,E. Elton Catalano

## 2021-07-04 NOTE — ED Notes (Signed)
Patient provided with warm blankets and repositioned and husband provided with recliner chair and snacks both verbalize understanding that she, the patient is ordered a NPO diet and is to have nothing to eat or drink

## 2021-07-04 NOTE — Discharge Instructions (Signed)
[facility] ° °Please arrive at least 30 min before your appointment to complete your check in paperwork.  If you are unable to arrive 30 min prior to your appointment time we may have to cancel or reschedule you. °LAPAROSCOPIC SURGERY: POST OP INSTRUCTIONS °Always review your discharge instruction sheet given to you by the facility where your surgery was performed. °IF YOU HAVE DISABILITY OR FAMILY LEAVE FORMS, YOU MUST BRING THEM TO THE OFFICE FOR PROCESSING.   °DO NOT GIVE THEM TO YOUR DOCTOR. ° °PAIN CONTROL ° °First take acetaminophen (Tylenol) AND/or ibuprofen (Advil) to control your pain after surgery.  Follow directions on package.  Taking acetaminophen (Tylenol) and/or ibuprofen (Advil) regularly after surgery will help to control your pain and lower the amount of prescription pain medication you may need.  You should not take more than 4,000 mg (4 grams) of acetaminophen (Tylenol) in 24 hours.  You should not take ibuprofen (Advil), aleve, motrin, naprosyn or other NSAIDS if you have a history of stomach ulcers or chronic kidney disease.  °A prescription for pain medication may be given to you upon discharge.  Take your pain medication as prescribed, if you still have uncontrolled pain after taking acetaminophen (Tylenol) or ibuprofen (Advil). °Use ice packs to help control pain. °If you need a refill on your pain medication, please contact your pharmacy.  They will contact our office to request authorization. Prescriptions will not be filled after 5pm or on week-ends. ° °HOME MEDICATIONS °Take your usually prescribed medications unless otherwise directed. ° °DIET °You should follow a light diet the first few days after arrival home.  Be sure to include lots of fluids daily. Avoid fatty, fried foods.  ° °CONSTIPATION °It is common to experience some constipation after surgery and if you are taking pain medication.  Increasing fluid intake and taking a stool softener (such as Colace)  will usually help or prevent this problem from occurring.  A mild laxative (Milk of Magnesia or Miralax) should be taken according to package instructions if there are no bowel movements after 48 hours. ° °WOUND/INCISION CARE °Most patients will experience some swelling and bruising in the area of the incisions.  Ice packs will help.  Swelling and bruising can take several days to resolve.  °Unless discharge instructions indicate otherwise, follow guidelines below  °STERI-STRIPS - you may remove your outer bandages 48 hours after surgery, and you may shower at that time.  You have steri-strips (small skin tapes) in place directly over the incision.  These strips should be left on the skin for 7-10 days.   °DERMABOND/SKIN GLUE - you may shower in 24 hours.  The glue will flake off over the next 2-3 weeks. °Any sutures or staples will be removed at the office during your follow-up visit. ° °ACTIVITIES °You may resume regular (light) daily activities beginning the next day--such as daily self-care, walking, climbing stairs--gradually increasing activities as tolerated.  You may have sexual intercourse when it is comfortable.  Refrain from any heavy lifting or straining until approved by your doctor. °You may drive when you are no longer taking prescription pain medication, you can comfortably wear a seatbelt, and you can safely maneuver your car and apply brakes. ° °FOLLOW-UP °You should see your doctor in the office for a follow-up appointment approximately 2-3 weeks after your surgery.  You should have been given your post-op/follow-up appointment when your surgery was scheduled.  If you did not receive a post-op/follow-up appointment, make sure   that you call for this appointment within a day or two after you arrive home to insure a convenient appointment time. ° ° °WHEN TO CALL YOUR DOCTOR: °Fever over 101.0 °Inability to urinate °Continued bleeding from incision. °Increased pain, redness, or drainage from the  incision. °Increasing abdominal pain ° °The clinic staff is available to answer your questions during regular business hours.  Please don’t hesitate to call and ask to speak to one of the nurses for clinical concerns.  If you have a medical emergency, go to the nearest emergency room or call 911.  A surgeon from [facility] is always on call at the hospital. °[address], [address], [place], [place]  [address] ? [address] [address], [place], [place]   [address] °[phone] ? [phone] ? FAX [phone] ° ° ° ° °Managing Your Pain After Surgery Without Opioids ° ° ° °Thank you for participating in our program to help patients manage their pain after surgery without opioids. This is part of our effort to provide you with the best care possible, without exposing you or your family to the risk that opioids pose. ° °What pain can I expect after surgery? °You can expect to have some pain after surgery. This is normal. The pain is typically worse the day after surgery, and quickly begins to get better. °Many studies have found that many patients are able to manage their pain after surgery with Over-the-Counter (OTC) medications such as Tylenol and Motrin. If you have a condition that does not allow you to take Tylenol or Motrin, notify your surgical team. ° °How will I manage my pain? °The best strategy for controlling your pain after surgery is around the clock pain control with Tylenol (acetaminophen) and Motrin (ibuprofen or Advil). Alternating these medications with each other allows you to maximize your pain control. In addition to Tylenol and Motrin, you can use heating pads or ice packs on your incisions to help reduce your pain. ° °How will I alternate your regular strength over-the-counter pain medication? °You will take a dose of pain medication every three hours. °Start by taking 650 mg of Tylenol (2 pills of 325 mg) °3 hours later take 600 mg of Motrin (3 pills of 200 mg) °3 hours after  taking the Motrin take 650 mg of Tylenol °3 hours after that take 600 mg of Motrin. ° ° °- 1 - ° °See example - if your first dose of Tylenol is at 12:00 PM ° ° °12:00 PM Tylenol 650 mg (2 pills of 325 mg)  °3:00 PM Motrin 600 mg (3 pills of 200 mg)  °6:00 PM Tylenol 650 mg (2 pills of 325 mg)  °9:00 PM Motrin 600 mg (3 pills of 200 mg)  °Continue alternating every 3 hours  ° °We recommend that you follow this schedule around-the-clock for at least 3 days after surgery, or until you feel that it is no longer needed. Use the table on the last page of this handout to keep track of the medications you are taking. °Important: °Do not take more than 3000mg of Tylenol or 3200mg of Motrin in a 24-hour period. °Do not take ibuprofen/Motrin if you have a history of bleeding stomach ulcers, severe kidney disease, &/or actively taking a blood thinner ° °What if I still have pain? °If you have pain that is not controlled with the over-the-counter pain medications (Tylenol and Motrin or Advil) you might have what we call “breakthrough” pain. You will receive a prescription   for a small amount of an opioid pain medication such as Oxycodone, Tramadol, or Tylenol with Codeine. Use these opioid pills in the first 24 hours after surgery if you have breakthrough pain. Do not take more than 1 pill every 4-6 hours. ° °If you still have uncontrolled pain after using all opioid pills, don't hesitate to call our staff using the number provided. We will help make sure you are managing your pain in the best way possible, and if necessary, we can provide a prescription for additional pain medication. ° ° °Day 1   ° °Time  °Name of Medication Number of pills taken  °Amount of Acetaminophen  °Pain Level  ° °Comments  °AM PM       °AM PM       °AM PM       °AM PM       °AM PM       °AM PM       °AM PM       °AM PM       °Total Daily amount of Acetaminophen °Do not take more than  3,000 mg per day    ° ° °Day 2   ° °Time  °Name of Medication  Number of pills °taken  °Amount of Acetaminophen  °Pain Level  ° °Comments  °AM PM       °AM PM       °AM PM       °AM PM       °AM PM       °AM PM       °AM PM       °AM PM       °Total Daily amount of Acetaminophen °Do not take more than  3,000 mg per day    ° ° °Day 3   ° °Time  °Name of Medication Number of pills taken  °Amount of Acetaminophen  °Pain Level  ° °Comments  °AM PM       °AM PM       °AM PM       °AM PM       ° ° ° °AM PM       °AM PM       °AM PM       °AM PM       °Total Daily amount of Acetaminophen °Do not take more than  3,000 mg per day    ° ° °Day 4   ° °Time  °Name of Medication Number of pills taken  °Amount of Acetaminophen  °Pain Level  ° °Comments  °AM PM       °AM PM       °AM PM       °AM PM       °AM PM       °AM PM       °AM PM       °AM PM       °Total Daily amount of Acetaminophen °Do not take more than  3,000 mg per day    ° ° °Day 5   ° °Time  °Name of Medication Number °of pills taken  °Amount of Acetaminophen  °Pain Level  ° °Comments  °AM PM       °AM PM       °AM PM       °AM PM       °AM PM       °AM   PM       °AM PM       °AM PM       °Total Daily amount of Acetaminophen °Do not take more than  3,000 mg per day    ° ° ° °Day 6   ° °Time  °Name of Medication Number of pills °taken  °Amount of Acetaminophen  °Pain Level  °Comments  °AM PM       °AM PM       °AM PM       °AM PM       °AM PM       °AM PM       °AM PM       °AM PM       °Total Daily amount of Acetaminophen °Do not take more than  3,000 mg per day    ° ° °Day 7   ° °Time  °Name of Medication Number of pills taken  °Amount of Acetaminophen  °Pain Level  ° °Comments  °AM PM       °AM PM       °AM PM       °AM PM       °AM PM       °AM PM       °AM PM       °AM PM       °Total Daily amount of Acetaminophen °Do not take more than  3,000 mg per day    ° ° ° ° °For additional information about how and where to safely dispose of unused opioid °medications - https://www.morepowerfulnc.org ° °Disclaimer: This document  contains information and/or instructional materials adapted from [facility] for the typical patient with your condition. It does not replace medical advice from your health care provider because your experience may differ from that of the °typical patient. Talk to your health care provider if you have any questions about this °document, your condition or your treatment plan. °Adapted from [facility] ° °

## 2021-07-04 NOTE — Interval H&P Note (Signed)
History and Physical Interval Note:  07/04/2021 9:00 AM  Casey Barry  has presented today for surgery, with the diagnosis of acute cholecystitis.  The various methods of treatment have been discussed with the patient and family. After consideration of risks, benefits and other options for treatment, the patient has consented to  Procedure(s): LAPAROSCOPIC CHOLECYSTECTOMY WITH ICG DYE (N/A) as a surgical intervention.  The patient's history has been reviewed, patient examined, no change in status, stable for surgery.  I have reviewed the patient's chart and labs.  Questions were answered to the patient's satisfaction.     Gaynelle Adu

## 2021-07-04 NOTE — Op Note (Signed)
Casey Barry [id number] September 04, 1980 07/04/2021  Laparoscopic Cholecystectomy with ICG dye and right abdominal wall tap block procedure Note  Indications: This patient presents with symptomatic gallbladder disease and will undergo laparoscopic cholecystectomy.  Pre-operative Diagnosis: Symptomatic cholelithiasis  Post-operative Diagnosis: Same  Surgeon: Gaynelle Adu MD FACS  Assistants: Barnetta Chapel PA-C  Anesthesia: General endotracheal anesthesia   Procedure Details  The patient was seen again in the Holding Room. The risks, benefits, complications, treatment options, and expected outcomes were discussed with the patient. The possibilities of reaction to medication, pulmonary aspiration, perforation of viscus, bleeding, recurrent infection, finding a normal gallbladder, the need for additional procedures, failure to diagnose a condition, the possible need to convert to an open procedure, and creating a complication requiring transfusion or operation were discussed with the patient. The likelihood of improving the patient's symptoms with return to their baseline status is good.  The patient and/or family concurred with the proposed plan, giving informed consent. The site of surgery properly noted. The patient was taken to Operating Room, identified as Casey Barry and the procedure verified as Laparoscopic Cholecystectomy with ICG dye. A Time Out was held and the above information confirmed. Antibiotic prophylaxis was administered.   Prior to the induction of general anesthesia, antibiotic prophylaxis was administered. General endotracheal anesthesia was then administered and tolerated well. After the induction, the abdomen was prepped with Chloraprep and draped in the sterile fashion. The patient was positioned in the supine position.  Local anesthetic agent was injected into the skin near the umbilicus and an incision made. We dissected down to the abdominal fascia with blunt dissection.   The fascia was incised vertically and we entered the peritoneal cavity bluntly.  A pursestring suture of 0-Vicryl was placed around the fascial opening.  The Hasson cannula was inserted and secured with the stay suture.  Pneumoperitoneum was then created with CO2 and tolerated well without any adverse changes in the patient's vital signs. An 5-mm port was placed in the subxiphoid position.  Two 5-mm ports were placed in the right upper quadrant. All skin incisions were infiltrated with a local anesthetic agent before making the incision and placing the trocars.   We positioned the patient in reverse Trendelenburg, tilted slightly to the patient's left.  The gallbladder was identified, the fundus grasped and retracted cephalad. Adhesions were lysed bluntly and with the electrocautery where indicated, taking care not to injure any adjacent organs or viscus. The infundibulum was grasped and retracted laterally, exposing the peritoneum overlying the triangle of Calot. This was then divided and exposed in a blunt fashion. A critical view of the cystic duct and cystic artery was obtained.  The cystic duct was clearly identified and bluntly dissected circumferentially.  Near infrared immunofluorescence activity was activated and we could see near infrared immunofluorescence activity within the liver, cystic duct, and common bile duct.  I could see the confluence of the cystic duct with the common bile duct.  The patient had a long cystic duct.  This confirmed my critical view that I had established above.   The cystic duct was then ligated with clips and divided. The cystic artery (both a small anterior and larger posterior branch) which had been identified & dissected free was ligated with clips and divided as well.   The gallbladder was dissected from the liver bed in retrograde fashion with the electrocautery.  There was some spillage of bile from the gallbladder but no stone spillage.  The gallbladder was  removed and  placed in an Ecco sac.  The gallbladder and Ecco sac were then removed through the umbilical port site. The liver bed was irrigated and inspected. Hemostasis was achieved with the electrocautery. Copious irrigation was utilized and was repeatedly aspirated until clear.  The pursestring suture was used to close the umbilical fascia.  An additional interrupted 0 Vicryl suture was placed at the umbilical fascia with a PMI suture passer with laparoscopic guidance.  We again inspected the right upper quadrant for hemostasis.  A tap block was performed along the right side of the abdomen using laparoscopic guidance for postoperative pain relief.  The umbilical closure was inspected and there was no air leak and nothing trapped within the closure. Pneumoperitoneum was released as we removed the trocars.  4-0 Monocryl was used to close the skin.  Dermabond was applied. The patient was then extubated and brought to the recovery room in stable condition. Instrument, sponge, and needle counts were correct at closure and at the conclusion of the case.   Findings: Positive critical view, near infrared immunofluorescence activity seen in the cystic duct, common bile duct.  Estimated Blood Loss: Minimal         Drains: none         Specimens: Gallbladder           Complications: None; patient tolerated the procedure well.         Disposition: PACU - hemodynamically stable.         Condition: stable  Casey Barry. Andrey Campanile, MD, FACS General, Bariatric, & Minimally Invasive Surgery [facility], [place]

## 2021-07-05 ENCOUNTER — Encounter (HOSPITAL_COMMUNITY): Payer: Self-pay | Admitting: General Surgery

## 2021-07-05 LAB — SURGICAL PATHOLOGY

## 2021-09-20 ENCOUNTER — Encounter (HOSPITAL_COMMUNITY): Payer: Self-pay | Admitting: Emergency Medicine

## 2021-09-20 ENCOUNTER — Emergency Department (HOSPITAL_COMMUNITY)
Admission: EM | Admit: 2021-09-20 | Discharge: 2021-09-20 | Disposition: A | Discharge status: Home / Self Care | Payer: Medicaid Other | Attending: Emergency Medicine | Admitting: Emergency Medicine

## 2021-09-20 DIAGNOSIS — M542 Cervicalgia: Secondary | ICD-10-CM | POA: Insufficient documentation

## 2021-09-20 DIAGNOSIS — N3090 Cystitis, unspecified without hematuria: Secondary | ICD-10-CM | POA: Insufficient documentation

## 2021-09-20 DIAGNOSIS — R590 Localized enlarged lymph nodes: Secondary | ICD-10-CM

## 2021-09-20 LAB — URINALYSIS, ROUTINE W REFLEX MICROSCOPIC
Bilirubin Urine: NEGATIVE
Glucose, UA: NEGATIVE mg/dL
Hgb urine dipstick: NEGATIVE
Ketones, ur: 20 mg/dL — AB
Leukocytes,Ua: NEGATIVE
Nitrite: NEGATIVE
Protein, ur: 30 mg/dL — AB
Specific Gravity, Urine: 1.027 (ref 1.005–1.030)
pH: 5 (ref 5.0–8.0)

## 2021-09-20 LAB — PREGNANCY, URINE: Preg Test, Ur: NEGATIVE

## 2021-09-20 MED ORDER — DOXYCYCLINE HYCLATE 100 MG PO CAPS
100.0000 mg | ORAL_CAPSULE | Freq: Two times a day (BID) | ORAL | 0 refills | Status: AC
Start: 2021-09-20 — End: 2021-09-27

## 2021-09-20 NOTE — ED Notes (Signed)
Discharge instructions reviewed and education provided. All questions answered. Pt voices understanding. Pt ambulated out of ER in stable condition with all belongings

## 2021-09-20 NOTE — ED Triage Notes (Signed)
Pt. Stated, I have a cyst or bump that has come up on the left side of my neck. I also have interstitial cystitis and either Im having a flare up or I have a UTI

## 2021-09-20 NOTE — Discharge Instructions (Signed)
You were seen in the emergency room today with neck swelling and lower abdominal discomfort.  I am starting on antibiotics and will have you monitor your symptoms.  If your swelling does not resolve in the next 2 weeks I would like for you to follow with ear nose and throat.
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
## 2021-09-20 NOTE — ED Provider Triage Note (Signed)
Emergency Medicine Provider Triage Evaluation Note  Casey Barry , a 41 y.o. female  was evaluated in triage.  Pt complains of dysuria. Hx of interstitial cystitis. Also concerned about "lump" on left side of neck. No recent illness  Review of Systems  Positive: Dysuria, lymphad.  Negative: Recent illness, hematuria  Physical Exam  Temp 98.1 F (36.7 C)   Resp 16   Ht 5' 0.5" (1.537 m)   Wt 77.1 kg   SpO2 100%   BMI 32.65 kg/m  Gen:   Awake, no distress   Resp:  Normal effort  MSK:   Moves extremities without difficulty  Other:  Dysuria and inflamed left cervical lymphaden. Firm but mobile.  Medical Decision Making  Medically screening exam initiated at 9:45 AM.  Appropriate orders placed.  Casey Barry was informed that the remainder of the evaluation will be completed by another provider, this initial triage assessment does not replace that evaluation, and the importance of remaining in the ED until their evaluation is complete.     Saddie Benders, [place] 09/20/21 [phone]

## 2022-01-24 DIAGNOSIS — Z79899 Other long term (current) drug therapy: Secondary | ICD-10-CM | POA: Diagnosis not present

## 2022-01-24 DIAGNOSIS — F410 Panic disorder [episodic paroxysmal anxiety] without agoraphobia: Secondary | ICD-10-CM | POA: Diagnosis not present

## 2022-02-04 ENCOUNTER — Ambulatory Visit (INDEPENDENT_AMBULATORY_CARE_PROVIDER_SITE_OTHER): Payer: Medicaid Other | Admitting: Internal Medicine

## 2022-02-04 ENCOUNTER — Encounter: Payer: Self-pay | Admitting: Internal Medicine

## 2022-02-04 VITALS — BP 124/87 | HR 91 | Ht 61.5 in | Wt 177.0 lb

## 2022-02-04 DIAGNOSIS — E559 Vitamin D deficiency, unspecified: Secondary | ICD-10-CM | POA: Diagnosis not present

## 2022-02-04 DIAGNOSIS — F419 Anxiety disorder, unspecified: Secondary | ICD-10-CM

## 2022-02-04 DIAGNOSIS — E669 Obesity, unspecified: Secondary | ICD-10-CM | POA: Diagnosis not present

## 2022-02-04 DIAGNOSIS — N3010 Interstitial cystitis (chronic) without hematuria: Secondary | ICD-10-CM

## 2022-02-04 DIAGNOSIS — E66811 Obesity, class 1: Secondary | ICD-10-CM
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
## 2022-02-04 NOTE — Assessment & Plan Note (Signed)
BMI 32. - Discussed general diet and exercise recommendations.  - Lipid panel - Hemoglobin A1C - CMP14+EGFR - CBC with Differential/Platelet - TSH - VITAMIN D 25 Hydroxy (Vit-D Deficiency, Fractures

## 2022-02-04 NOTE — Assessment & Plan Note (Addendum)
-  Recommend patient follows up with urology referral was replaced by another provider.  - Patient is establishing care here in our clinic and I will discuss with her assigned PCP the management of her chronic pain until she is established in a pain clinic. According to her prior PCP notes they were weaning medication before she was dismissed from practice. It appears on PDMP review patient was last prescribed opioid medication in October. - F/u in one month with PCP

## 2022-02-04 NOTE — Patient Instructions (Signed)
Thank you for trusting me with your care. To recap, today we discussed the following:   Check labs today. I recommend increasing activity level. General recommendations below.   Regular physical activity is one of the most important things you can do for your health. If you're ready to get the immediate benefits of better sleep, reduced anxiety, and lower blood pressure, here are ways to get started. Look for ways to reduce time sitting and increase time moving. For example, make it a tradition to walk before or after dinner. I recommend at least 150 minutes of moderate-intensity physical activity a week for adults. You might split that into 30 minutes, 5 days a week.  - Lipid panel - Hemoglobin A1C - CMP14+EGFR - CBC with Differential/Platelet - TSH - VITAMIN D 25 Hydroxy (Vit-D Deficiency, Fractures)

## 2022-02-05 ENCOUNTER — Other Ambulatory Visit: Payer: Self-pay | Admitting: Internal Medicine

## 2022-02-05 DIAGNOSIS — N3010 Interstitial cystitis (chronic) without hematuria: Secondary | ICD-10-CM

## 2022-02-05 LAB — TSH: TSH: 1.46 u[IU]/mL (ref 0.450–4.500)

## 2022-02-05 LAB — CMP14+EGFR
ALT: 25 IU/L (ref 0–32)
AST: 16 IU/L (ref 0–40)
Albumin/Globulin Ratio: 2.2 (ref 1.2–2.2)
Albumin: 4.9 g/dL (ref 3.9–4.9)
Alkaline Phosphatase: 77 IU/L (ref 44–121)
BUN/Creatinine Ratio: 18 (ref 9–23)
BUN: 11 mg/dL (ref 6–24)
Bilirubin Total: 0.7 mg/dL (ref 0.0–1.2)
CO2: 21 mmol/L (ref 20–29)
Calcium: 9.3 mg/dL (ref 8.7–10.2)
Chloride: 103 mmol/L (ref 96–106)
Creatinine, Ser: 0.61 mg/dL (ref 0.57–1.00)
Globulin, Total: 2.2 g/dL (ref 1.5–4.5)
Glucose: 85 mg/dL (ref 70–99)
Potassium: 4.2 mmol/L (ref 3.5–5.2)
Sodium: 139 mmol/L (ref 134–144)
Total Protein: 7.1 g/dL (ref 6.0–8.5)
eGFR: 114 mL/min/{1.73_m2} (ref >59)

## 2022-02-05 LAB — CBC WITH DIFFERENTIAL/PLATELET
Basophils Absolute: 0.1 10*3/uL (ref 0.0–0.2)
Basos: 1 %
EOS (ABSOLUTE): 0.1 10*3/uL (ref 0.0–0.4)
Eos: 2 %
Hematocrit: 40 % (ref 34.0–46.6)
Hemoglobin: 13.7 g/dL (ref 11.1–15.9)
Immature Grans (Abs): 0 10*3/uL (ref 0.0–0.1)
Immature Granulocytes: 0 %
Lymphocytes Absolute: 2.4 10*3/uL (ref 0.7–3.1)
Lymphs: 33 %
MCH: 32.5 pg (ref 26.6–33.0)
MCHC: 34.3 g/dL (ref 31.5–35.7)
MCV: 95 fL (ref 79–97)
Monocytes Absolute: 0.5 10*3/uL (ref 0.1–0.9)
Monocytes: 7 %
Neutrophils Absolute: 4.3 10*3/uL (ref 1.4–7.0)
Neutrophils: 57 %
Platelets: 246 10*3/uL (ref 150–450)
RBC: 4.21 x10E6/uL (ref 3.77–5.28)
RDW: 12.7 % (ref 11.7–15.4)
WBC: 7.4 10*3/uL (ref 3.4–10.8)

## 2022-02-05 LAB — LIPID PANEL
Chol/HDL Ratio: 2.3 ratio (ref 0.0–4.4)
Cholesterol, Total: 187 mg/dL (ref 100–199)
HDL: 81 mg/dL (ref >39)
LDL Chol Calc (NIH): 96 mg/dL (ref 0–99)
Triglycerides: 53 mg/dL (ref 0–149)
VLDL Cholesterol Cal: 10 mg/dL (ref 5–40)

## 2022-02-05 LAB — HEMOGLOBIN A1C
Est. average glucose Bld gHb Est-mCnc: 114 mg/dL
Hgb A1c MFr Bld: 5.6 % (ref 4.8–5.6)

## 2022-02-05 LAB — VITAMIN D 25 HYDROXY (VIT D DEFICIENCY, FRACTURES): Vit D, 25-Hydroxy: 45.8 ng/mL (ref 30.0–100.0)

## 2022-02-05 MED ORDER — AMITRIPTYLINE HCL 10 MG PO TABS: 10.0000 mg | ORAL_TABLET | Freq: Every day | ORAL | 0 refills | Status: DC

## 2022-02-05 NOTE — Progress Notes (Signed)
Reviewed normal labs with patient. She is willing to try amitriptyline for interstitial cystitis.  She was prescribed this before but did not find it completely effective. Will start at 10 mg and can be increase at follow up visit. Discussed avoiding opioids at this time. She has managed off of them since October.

## 2022-02-13 ENCOUNTER — Ambulatory Visit: Payer: Medicaid Other | Admitting: Internal Medicine

## 2022-03-05 ENCOUNTER — Other Ambulatory Visit: Payer: Self-pay | Admitting: Family Medicine

## 2022-03-05 ENCOUNTER — Ambulatory Visit (INDEPENDENT_AMBULATORY_CARE_PROVIDER_SITE_OTHER): Payer: Medicaid Other | Admitting: Family Medicine

## 2022-03-05 ENCOUNTER — Encounter: Payer: Self-pay | Admitting: Family Medicine

## 2022-03-05 VITALS — BP 125/82 | HR 78 | Ht 61.5 in | Wt 172.0 lb

## 2022-03-05 DIAGNOSIS — N3010 Interstitial cystitis (chronic) without hematuria: Secondary | ICD-10-CM | POA: Diagnosis not present

## 2022-03-05 DIAGNOSIS — F419 Anxiety disorder, unspecified: Secondary | ICD-10-CM

## 2022-03-05 DIAGNOSIS — R5383 Other fatigue: Secondary | ICD-10-CM | POA: Diagnosis not present

## 2022-03-05 DIAGNOSIS — G8929 Other chronic pain: Secondary | ICD-10-CM

## 2022-03-05 LAB — POCT URINALYSIS DIP (CLINITEK)
Bilirubin, UA: NEGATIVE
Blood, UA: NEGATIVE
Glucose, UA: NEGATIVE mg/dL
Ketones, POC UA: NEGATIVE mg/dL
Nitrite, UA: POSITIVE — AB
POC PROTEIN,UA: NEGATIVE
Spec Grav, UA: 1.025 (ref 1.010–1.025)
Urobilinogen, UA: 0.2 E.U./dL
pH, UA: 5.5 (ref 5.0–8.0)

## 2022-03-05 MED ORDER — SULFAMETHOXAZOLE-TRIMETHOPRIM 800-160 MG PO TABS: 1.0000 | ORAL_TABLET | Freq: Two times a day (BID) | ORAL | 0 refills | Status: AC

## 2022-03-05 NOTE — Assessment & Plan Note (Addendum)
Complains of urgency and frequency of urination UA positive for nitrites and leukocytes We will treat with Bactrim for 5 days Complains of interstitial cystitis pain Will like to resume therapy with opioids Inform patient that a referral will be placed to pain management as i cannot refill her opioids while she is on Xanax  Inform patient that taking opioids with Xanax increases her risk for respiratory depression Patient reports that her previous provider refilled her medications She was started on amitriptyline 10 mg daily and reports minimal relief of her symptoms She reports that opioid medications are the only therapy that provides relief She did follow-up with pain management and declined nonopioid therapy Encouraged patient to continue taking amitriptyline 10 mg daily and follow-up on referral placed to urology

## 2022-03-05 NOTE — Progress Notes (Signed)
Established Patient Office Visit  Subjective:  Patient ID: Casey Barry, female    DOB: Lusia 02, 1982  Age: 42 y.o. MRN: [id number]  CC:  Chief Complaint  Patient presents with   Follow-up    4 week f/u.     HPI Casey Barry is a 42 y.o. female with past medical history of symptomatic cholelithiasis, interstitial cystits, chronic benzodiazepine use, and chronic opioid use presents for f/u of  chronic medical conditions. For the details of today's visit, please refer to the assessment and plan.     Past Medical History:  Diagnosis Date   Cystitis    Gall stones    Kidney stones    Migraines    Pulmonary embolism ([place])     Past Surgical History:  Procedure Laterality Date   AUGMENTATION MAMMAPLASTY     CHOLECYSTECTOMY N/A 07/04/2021   Procedure: LAPAROSCOPIC CHOLECYSTECTOMY WITH ICG DYE;  Surgeon: Greer Pickerel, MD;  Location: [facility];  Service: General;  Laterality: N/A;   TONSILLECTOMY      Family History  Problem Relation Age of Onset   COPD Mother    Hypertension Father    COPD Maternal Grandmother    Diabetes Maternal Grandmother    Lung cancer Maternal Grandmother    Parkinson's disease Maternal Grandfather    Hypertension Paternal Grandmother    Depression Paternal Grandmother    Anxiety disorder Paternal Grandmother    Breast cancer Other     Social History   Socioeconomic History   Marital status: Single    Spouse name: Not on file   Number of children: Not on file   Years of education: Not on file   Highest education level: Not on file  Occupational History   Not on file  Tobacco Use   Smoking status: Never   Smokeless tobacco: Never  Substance and Sexual Activity   Alcohol use: No   Drug use: No   Sexual activity: Not Currently  Other Topics Concern   Not on file  Social History Narrative   Not on file   Social Determinants of Health   Financial Resource Strain: Not on file  Food Insecurity: Not on file  Transportation Needs: Not on  file  Physical Activity: Not on file  Stress: Not on file  Social Connections: Not on file  Intimate Partner Violence: Not on file    Outpatient Medications Prior to Visit  Medication Sig Dispense Refill   amitriptyline (ELAVIL) 10 MG tablet Take 1 tablet (10 mg total) by mouth at bedtime. (Patient not taking: Reported on 03/05/2022) 30 tablet 0   alprazolam (XANAX) 2 MG tablet Take 2 mg by mouth at bedtime as needed for anxiety.  (Patient not taking: Reported on 03/05/2022)     etonogestrel (NEXPLANON) 68 MG IMPL implant 1 each by Subdermal route once. (Patient not taking: Reported on 03/05/2022)     ibuprofen (ADVIL) 200 MG tablet Take 200 mg by mouth every 6 (six) hours as needed. (Patient not taking: Reported on 03/05/2022)     No facility-administered medications prior to visit.    Allergies  Allergen Reactions   Levaquin [Levofloxacin] Anaphylaxis    ROS Review of Systems  Constitutional:  Negative for chills and fever.  Eyes:  Negative for visual disturbance.  Respiratory:  Negative for chest tightness and shortness of breath.   Genitourinary:  Positive for frequency and urgency.  Neurological:  Negative for dizziness and headaches.      Objective:    Physical Exam  HENT:     Head: Normocephalic.     Mouth/Throat:     Mouth: Mucous membranes are moist.  Cardiovascular:     Rate and Rhythm: Normal rate.     Heart sounds: Normal heart sounds.  Pulmonary:     Effort: Pulmonary effort is normal.     Breath sounds: Normal breath sounds.  Abdominal:     Tenderness: There is abdominal tenderness in the suprapubic area. There is no right CVA tenderness or left CVA tenderness.  Neurological:     Mental Status: She is alert.     BP 125/82   Pulse 78   Ht 5' 1.5" (1.562 m)   Wt 172 lb 0.6 oz (78 kg)   SpO2 98%   BMI 31.98 kg/m  Wt Readings from Last 3 Encounters:  03/05/22 172 lb 0.6 oz (78 kg)  02/04/22 177 lb (80.3 kg)  09/20/21 170 lb (77.1 kg)    Lab  Results  Component Value Date   TSH 1.460 02/04/2022   Lab Results  Component Value Date   WBC 7.4 02/04/2022   HGB 13.7 02/04/2022   HCT 40.0 02/04/2022   MCV 95 02/04/2022   PLT 246 02/04/2022   Lab Results  Component Value Date   NA 139 02/04/2022   K 4.2 02/04/2022   CO2 21 02/04/2022   GLUCOSE 85 02/04/2022   BUN 11 02/04/2022   CREATININE 0.61 02/04/2022   BILITOT 0.7 02/04/2022   ALKPHOS 77 02/04/2022   AST 16 02/04/2022   ALT 25 02/04/2022   PROT 7.1 02/04/2022   ALBUMIN 4.9 02/04/2022   CALCIUM 9.3 02/04/2022   ANIONGAP 10 07/04/2021   EGFR 114 02/04/2022   Lab Results  Component Value Date   CHOL 187 02/04/2022   Lab Results  Component Value Date   HDL 81 02/04/2022   Lab Results  Component Value Date   LDLCALC 96 02/04/2022   Lab Results  Component Value Date   TRIG 53 02/04/2022   Lab Results  Component Value Date   CHOLHDL 2.3 02/04/2022   Lab Results  Component Value Date   HGBA1C 5.6 02/04/2022      Assessment & Plan:  IC (interstitial cystitis) Assessment & Plan: Complains of urgency and frequency of urination UA positive for nitrites and leukocytes We will treat with Bactrim for 5 days Complains of interstitial cystitis pain Will like to resume therapy with opioids Inform patient that a referral will be placed to pain management as i cannot refill her opioids while she is on Xanax  Inform patient that taking opioids with Xanax increases her risk for respiratory depression Patient reports that her previous provider refilled her medications She was started on amitriptyline 10 mg daily and reports minimal relief of her symptoms She reports that opioid medications are the only therapy that provides relief She did follow-up with pain management and declined nonopioid therapy Encouraged patient to continue taking amitriptyline 10 mg daily and follow-up on referral placed to urology    Orders: -     POCT URINALYSIS DIP (CLINITEK) -      Sulfamethoxazole-Trimethoprim; Take 1 tablet by mouth 2 (two) times daily for 5 days.  Dispense: 10 tablet; Refill: 0 -     Ambulatory referral to Pain Clinic  Anxiety Assessment & Plan: Follow-up with her psychiatrist  Denies suicidal ideation or homicidal ideation She takes Xanax 2 mg as needed for anxiety   Other chronic pain -     Ambulatory referral to  Pain Clinic  Other fatigue Assessment & Plan: Reports taking phentermine for fatigue Reports that her previous provider prescribed her phentermine for fatigue and weight loss Inform patient that phentermine is not indicated for fatigue Encourage patient to take vitamin B complex (containing fursultiamine 50 mg, hydroxocobalamin 250 micrograms, pyridoxal phosphate 30 mg, and riboflavin 5 mg) or vitamin B12 1073mg daily Stable thyroid, vit D and hemoglobin levels on 02/04/2022     Follow-up: Return in about 3 months (around 06/03/2022).   GAlvira Monday FNP

## 2022-03-05 NOTE — Assessment & Plan Note (Signed)
Follow-up with her psychiatrist  Denies suicidal ideation or homicidal ideation She takes Xanax 2 mg as needed for anxiety

## 2022-03-05 NOTE — Assessment & Plan Note (Addendum)
Reports taking phentermine for fatigue Reports that her previous provider prescribed her phentermine for fatigue and weight loss Inform patient that phentermine is not indicated for fatigue Encourage patient to take vitamin B complex (containing fursultiamine 50 mg, hydroxocobalamin 250 micrograms, pyridoxal phosphate 30 mg, and riboflavin 5 mg) or vitamin B12 1024mg daily Stable thyroid, vit D and hemoglobin levels on 02/04/2022

## 2022-03-05 NOTE — Patient Instructions (Addendum)
I appreciate the opportunity to provide care to you today!    Follow up:  3 months  Labs: next visit  Fatigue -Vitamin B complex (containing fursultiamine 50 mg, hydroxocobalamin 250 micrograms, pyridoxal phosphate 30 mg, and riboflavin 5 mg) - B12 1065mg daily    Please continue to a heart-healthy diet and increase your physical activities. Try to exercise for 340ms at least five times a week.   Physical activity helps: Lower your blood glucose, improve your heart health, lower your blood pressure and cholesterol, burn calories to help manage her weight, gave you energy, lower stress, and improve his sleep.  The American diabetes Association (ADA) recommends being active for 2-1/2 hours (150 minutes) or more week.  Exercise for 30 minutes, 5 days a week (150 minutes total)     It was a pleasure to see you and I look forward to continuing to work together on your health and well-being. Please do not hesitate to call the office if you need care or have questions about your care.   Have a wonderful day and week. With Gratitude, GlAlvira MondaySN, FNP-BC

## 2022-03-06 DIAGNOSIS — Z79899 Other long term (current) drug therapy: Secondary | ICD-10-CM | POA: Diagnosis not present

## 2022-03-06 DIAGNOSIS — F410 Panic disorder [episodic paroxysmal anxiety] without agoraphobia: Secondary | ICD-10-CM | POA: Diagnosis not present

## 2022-03-25 DIAGNOSIS — G4700 Insomnia, unspecified: Secondary | ICD-10-CM | POA: Diagnosis not present

## 2022-03-25 DIAGNOSIS — F410 Panic disorder [episodic paroxysmal anxiety] without agoraphobia: Secondary | ICD-10-CM | POA: Diagnosis not present

## 2022-04-24 DIAGNOSIS — F4310 Post-traumatic stress disorder, unspecified: Secondary | ICD-10-CM | POA: Diagnosis not present

## 2022-04-24 DIAGNOSIS — F909 Attention-deficit hyperactivity disorder, unspecified type: Secondary | ICD-10-CM | POA: Diagnosis not present

## 2022-04-24 DIAGNOSIS — F410 Panic disorder [episodic paroxysmal anxiety] without agoraphobia: Secondary | ICD-10-CM | POA: Diagnosis not present

## 2022-04-28 ENCOUNTER — Other Ambulatory Visit: Payer: Self-pay

## 2022-04-28 ENCOUNTER — Encounter (HOSPITAL_COMMUNITY): Payer: Self-pay

## 2022-04-28 ENCOUNTER — Emergency Department (HOSPITAL_COMMUNITY)
Admission: EM | Admit: 2022-04-28 | Discharge: 2022-04-28 | Disposition: A | Discharge status: Home / Self Care | Payer: Medicaid Other | Attending: Emergency Medicine | Admitting: Emergency Medicine

## 2022-04-28 ENCOUNTER — Emergency Department (HOSPITAL_COMMUNITY): Payer: Medicaid Other

## 2022-04-28 DIAGNOSIS — N390 Urinary tract infection, site not specified: Secondary | ICD-10-CM

## 2022-04-28 DIAGNOSIS — R109 Unspecified abdominal pain: Secondary | ICD-10-CM | POA: Diagnosis not present

## 2022-04-28 DIAGNOSIS — R197 Diarrhea, unspecified: Secondary | ICD-10-CM | POA: Diagnosis not present

## 2022-04-28 DIAGNOSIS — K1230 Oral mucositis (ulcerative), unspecified: Secondary | ICD-10-CM | POA: Insufficient documentation

## 2022-04-28 LAB — COMPREHENSIVE METABOLIC PANEL
ALT: 12 U/L (ref 0–44)
AST: 14 U/L — ABNORMAL LOW (ref 15–41)
Albumin: 4.2 g/dL (ref 3.5–5.0)
Alkaline Phosphatase: 46 U/L (ref 38–126)
Anion gap: 9 (ref 5–15)
BUN: 14 mg/dL (ref 6–20)
CO2: 23 mmol/L (ref 22–32)
Calcium: 9.3 mg/dL (ref 8.9–10.3)
Chloride: 106 mmol/L (ref 98–111)
Creatinine, Ser: 0.71 mg/dL (ref 0.44–1.00)
GFR, Estimated: >60 mL/min (ref >60)
Glucose, Bld: 93 mg/dL (ref 70–99)
Potassium: 3.8 mmol/L (ref 3.5–5.1)
Sodium: 138 mmol/L (ref 135–145)
Total Bilirubin: 1 mg/dL (ref 0.3–1.2)
Total Protein: 7.1 g/dL (ref 6.5–8.1)

## 2022-04-28 LAB — URINALYSIS, ROUTINE W REFLEX MICROSCOPIC
Bilirubin Urine: NEGATIVE
Glucose, UA: NEGATIVE mg/dL
Ketones, ur: NEGATIVE mg/dL
Nitrite: NEGATIVE
Protein, ur: NEGATIVE mg/dL
Specific Gravity, Urine: 1.023 (ref 1.005–1.030)
pH: 5 (ref 5.0–8.0)

## 2022-04-28 LAB — I-STAT BETA HCG BLOOD, ED (MC, WL, AP ONLY): I-stat hCG, quantitative: <5 m[IU]/mL (ref <5)

## 2022-04-28 LAB — CBC
HCT: 39.5 % (ref 36.0–46.0)
Hemoglobin: 13.6 g/dL (ref 12.0–15.0)
MCH: 33.5 pg (ref 26.0–34.0)
MCHC: 34.4 g/dL (ref 30.0–36.0)
MCV: 97.3 fL (ref 80.0–100.0)
Platelets: 198 10*3/uL (ref 150–400)
RBC: 4.06 MIL/uL (ref 3.87–5.11)
RDW: 12.2 % (ref 11.5–15.5)
WBC: 6.3 10*3/uL (ref 4.0–10.5)
nRBC: 0 % (ref 0.0–0.2)

## 2022-04-28 LAB — LIPASE, BLOOD: Lipase: 27 U/L (ref 11–51)

## 2022-04-28 MED ORDER — SODIUM CHLORIDE 0.9 % IV BOLUS
1000.0000 mL | Freq: Once | INTRAVENOUS | Status: AC
  Administered 2022-04-28: 1000 mL via INTRAVENOUS

## 2022-04-28 MED ORDER — PHENAZOPYRIDINE HCL 200 MG PO TABS: 200.0000 mg | ORAL_TABLET | Freq: Three times a day (TID) | ORAL | 0 refills | Status: DC

## 2022-04-28 MED ORDER — CEPHALEXIN 500 MG PO CAPS: 500.0000 mg | ORAL_CAPSULE | Freq: Two times a day (BID) | ORAL | 0 refills | Status: DC

## 2022-04-28 MED ORDER — FENTANYL CITRATE PF 50 MCG/ML IJ SOSY
50.0000 ug | PREFILLED_SYRINGE | Freq: Once | INTRAMUSCULAR | Status: AC
  Administered 2022-04-28: 50 ug via INTRAVENOUS
  Filled 2022-04-28: qty 1

## 2022-04-28 NOTE — ED Notes (Signed)
Call placed to lab to add urine culture to previous collection  

## 2022-04-28 NOTE — ED Notes (Signed)
Patient denies pain and is resting comfortably.  

## 2022-04-28 NOTE — ED Provider Notes (Signed)
[place] EMERGENCY DEPARTMENT AT [facility] [place] Provider Note   CSN: [id number] Arrival date & time: 04/28/22  1127     History  Chief Complaint  Patient presents with   Diarrhea    Casey Barry is a 42 y.o. female.  Patient is a 42 year old female who presents with diarrhea.  She has a history of migraines, interstitial cystitis and prior gallstones.  She had her gallbladder removed June 2023.  She states that since that time she has had diarrhea.  She says pretty much anytime she eats or drinks anything she has diarrhea.  She also has some ongoing abdominal pain both in her upper and lower abdomen and some nausea at times.  No fevers.  No urinary symptoms other than she has some ongoing symptoms related to interstitial cystitis.  She talked to her PCP who recommended she come here for further evaluation and may be get a referral to gastroenterology.  She also has some ulcers in her mouth that she said developed over the weekend       Home Medications Prior to Admission medications   Medication Sig Start Date End Date Taking? Authorizing Provider  amitriptyline (ELAVIL) 10 MG tablet Take 1 tablet (10 mg total) by mouth at bedtime. Patient not taking: Reported on 03/05/2022 02/05/22   Gardenia PhlegmSteen, James J, MD  cephALEXin (KEFLEX) 500 MG capsule Take 1 capsule (500 mg total) by mouth 2 (two) times daily. 04/28/22  Yes Rolan BuccoBelfi, Darrelle Barrell, MD  phenazopyridine (PYRIDIUM) 200 MG tablet Take 1 tablet (200 mg total) by mouth 3 (three) times daily. 04/28/22  Yes Rolan BuccoBelfi, Mayley Lish, MD  alprazolam Prudy Feeler(XANAX) 2 MG tablet Take 2 mg by mouth at bedtime as needed for anxiety.  Patient not taking: Reported on 03/05/2022 04/30/17   [provider]  etonogestrel (NEXPLANON) 68 MG IMPL implant 1 each by Subdermal route once. Patient not taking: Reported on 03/05/2022    [provider]  ibuprofen (ADVIL) 200 MG tablet Take 200 mg by mouth every 6 (six) hours as needed. Patient not taking:  Reported on 03/05/2022    [provider]      Allergies    Levaquin [levofloxacin]    Review of Systems   Review of Systems  Constitutional:  Negative for chills, diaphoresis, fatigue and fever.  HENT:  Negative for congestion.   Eyes: Negative.   Respiratory:  Negative for cough, chest tightness and shortness of breath.   Cardiovascular:  Negative for chest pain.  Gastrointestinal:  Positive for abdominal pain, diarrhea and nausea. Negative for blood in stool and vomiting.  Genitourinary:  Negative for difficulty urinating, flank pain, frequency and hematuria.  Musculoskeletal:  Negative for arthralgias and back pain.  Skin:  Negative for rash.  Neurological:  Negative for weakness and headaches.    Physical Exam Updated Vital Signs BP 115/76   Pulse (!) 109   Temp 98.7 F (37.1 C)   Resp 18   Ht 5' 1.5" (1.562 m)   Wt 78 kg   SpO2 100%   BMI 31.97 kg/m  Physical Exam Constitutional:      Appearance: She is well-developed.  HENT:     Head: Normocephalic and atraumatic.     Mouth/Throat:     Comments: 3 mouth ulcers, 2 on her tongue and 1 under her tongue,.  Consistent with aphthous ulcers Eyes:     Pupils: Pupils are equal, round, and reactive to light.  Cardiovascular:     Rate and Rhythm: Normal rate  and regular rhythm.     Heart sounds: Normal heart sounds.  Pulmonary:     Effort: Pulmonary effort is normal. No respiratory distress.     Breath sounds: Normal breath sounds. No wheezing or rales.  Chest:     Chest wall: No tenderness.  Abdominal:     General: Bowel sounds are normal.     Palpations: Abdomen is soft.     Tenderness: There is abdominal tenderness (Mild generalized tenderness). There is no guarding or rebound.  Musculoskeletal:        General: Normal range of motion.     Cervical back: Normal range of motion and neck supple.  Lymphadenopathy:     Cervical: No cervical adenopathy.  Skin:    General: Skin is warm and dry.      Findings: No rash.  Neurological:     Mental Status: She is alert and oriented to person, place, and time.     ED Results / Procedures / Treatments   Labs (all labs ordered are listed, but only abnormal results are displayed) Labs Reviewed  COMPREHENSIVE METABOLIC PANEL - Abnormal; Notable for the following components:      Result Value   AST 14 (*)    All other components within normal limits  URINALYSIS, ROUTINE W REFLEX MICROSCOPIC - Abnormal; Notable for the following components:   APPearance HAZY (*)    Hgb urine dipstick SMALL (*)    Leukocytes,Ua MODERATE (*)    Bacteria, UA MANY (*)    All other components within normal limits  URINE CULTURE  LIPASE, BLOOD  CBC  I-STAT BETA HCG BLOOD, ED ([facility], [facility], [facility] ONLY)    EKG None  Radiology CT ABDOMEN PELVIS WO CONTRAST  Result Date: 04/28/2022 CLINICAL DATA:  Abdominal pain EXAM: CT ABDOMEN AND PELVIS WITHOUT CONTRAST TECHNIQUE: Multidetector CT imaging of the abdomen and pelvis was performed following the standard protocol without IV contrast. RADIATION DOSE REDUCTION: This exam was performed according to the departmental dose-optimization program which includes automated exposure control, adjustment of the mA and/or kV according to patient size and/or use of iterative reconstruction technique. COMPARISON:  Renal ultrasound July 03, 2021, CT abdomen and pelvis with contrast July 03, 2021 FINDINGS: Lower chest: The visualized bibasilar lungs clear. Normal heart size no pericardial effusion. Hepatobiliary: No focal liver abnormality is seen. Status post cholecystectomy. No biliary dilatation. Pancreas: Unremarkable. No pancreatic ductal dilatation or surrounding inflammatory changes. Spleen: Normal in size without focal abnormality. Adrenals/Urinary Tract: Adrenal glands are unremarkable. No hydronephrosis or nephrolithiasis. The previously identified hypodense renal lesions, measuring up to 13 mm in the right and 3 mm on the left, are not  well appreciated on this non-contrast exam. Bladder is unremarkable. Stomach/Bowel: Small hiatal hernia. The appendix is not visualized. No evidence of bowel wall thickening, distention, or inflammatory changes. Vascular/Lymphatic: No significant vascular findings are present. No enlarged abdominal or pelvic lymph nodes. Reproductive: Uterus and bilateral adnexa are unremarkable. Other: No abdominal wall hernia or abnormality. No abdominopelvic ascites. Partially visualized silicone breast implants in place. Musculoskeletal: No acute or significant osseous findings. IMPRESSION: No etiology for abdominal pain is identified. Electronically Signed   By: Jacob Moores M.D.   On: 04/28/2022 14:26    Procedures Procedures    Medications Ordered in ED Medications  sodium chloride 0.9 % bolus 1,000 mL (1,000 mLs Intravenous New Bag/Given 04/28/22 1310)  fentaNYL (SUBLIMAZE) injection 50 mcg (50 mcg Intravenous Given 04/28/22 1332)    ED Course/ Medical Decision Making/  A&P                             Medical Decision Making Amount and/or Complexity of Data Reviewed Labs: ordered. Radiology: ordered.  Risk Prescription drug management.   Patient is a 43 year old female who presents with almost 1 year history of diarrhea after she had her gallbladder removed.  It is watery diarrhea.  She does have some associated abdominal pain.  No fevers.  She had a CT scan which did not show any acute abnormality.  Her labs are reviewed by me and are nonconcerning.  Her urinalysis does show some concerns for infection and she does have some suprapubic discomfort.  She has a history of interstitial cystitis.  Her urine was sent for a culture.  Will start her on Keflex and she request Pyridium as well.  She does not wear contacts.  I had looked up her records and do not see any recent positive culture results.  She Zithranol well-appearing.  Will refer to gastroenterology to further evaluate her ongoing diarrhea.   Return precautions were given.  Final Clinical Impression(s) / ED Diagnoses Final diagnoses:  Diarrhea, unspecified type  Urinary tract infection without hematuria, site unspecified    Rx / DC Orders ED Discharge Orders          Ordered    cephALEXin (KEFLEX) 500 MG capsule  2 times daily        04/28/22 1501    phenazopyridine (PYRIDIUM) 200 MG tablet  3 times daily        04/28/22 1501              Rolan Bucco, MD 04/28/22 1507

## 2022-04-28 NOTE — Discharge Instructions (Signed)
Take the antibiotics as discussed.  Make an appointment to follow-up with the gastroenterologist listed above.  Return to the emergency room if you have any worsening symptoms.

## 2022-04-28 NOTE — ED Triage Notes (Addendum)
Reports had gallbladder removed last June and ever since has had diarrhea.  Reports even water she had diarrhea.  Reports everything she eats immediately comes out. Also reports burping up sulfa.  Patient also complains of ulcer in her mouth which she has never had.

## 2022-04-28 NOTE — ED Notes (Signed)
Patient did not want her temperature taken before discharge, her blood pressure was slightly low RN advised her to make sure she drinks enough fluids.

## 2022-04-30 LAB — URINE CULTURE: Culture: >=100000 — AB

## 2022-05-01 ENCOUNTER — Telehealth (HOSPITAL_BASED_OUTPATIENT_CLINIC_OR_DEPARTMENT_OTHER): Payer: Self-pay | Admitting: *Deleted

## 2022-05-01 NOTE — Telephone Encounter (Signed)
Post ED Visit - Positive Culture Follow-up  Culture report reviewed by antimicrobial stewardship pharmacist: [facility] []  Enzo Bi, Pharm.D. []  Celedonio Miyamoto, Pharm.D., BCPS AQ-ID []  Garvin Fila, Pharm.D., BCPS []  Georgina Pillion, [address].D., BCPS []  [place], [address].D., BCPS, AAHIVP []  Estella Husk, Pharm.D., BCPS, AAHIVP []  Lysle Pearl, PharmD, BCPS []  Phillips Climes, PharmD, BCPS []  Agapito Games, PharmD, BCPS []  Verlan Friends, PharmD []  Mervyn Gay, PharmD, BCPS []  Vinnie Level, PharmD  [facility] []  Len Childs, PharmD []  Greer Pickerel, PharmD []  Adalberto Cole, PharmD []  Perlie Gold, Rph []  Lonell Face) Jean Rosenthal, PharmD []  Earl Many, PharmD []  Junita Push, PharmD []  Dorna Leitz, PharmD []  Terrilee Files, PharmD []  Lynann Beaver, PharmD []  Keturah Barre, PharmD []  Loralee Pacas, PharmD []  Bernadene Person, PharmD   Positive urine culture Treated with Cephalexin, organism sensitive to the same and no further patient follow-up is required at this time.  Eldridge Scot, Pharm D  Virl Axe Talley 05/01/2022, 10:25 AM

## 2022-06-03 ENCOUNTER — Ambulatory Visit: Payer: Medicaid Other | Admitting: Family Medicine

## 2022-06-05 ENCOUNTER — Ambulatory Visit: Payer: Medicaid Other | Admitting: Family Medicine

## 2022-06-18 ENCOUNTER — Ambulatory Visit: Payer: Medicaid Other | Admitting: Urology

## 2022-06-25 DIAGNOSIS — F909 Attention-deficit hyperactivity disorder, unspecified type: Secondary | ICD-10-CM | POA: Diagnosis not present

## 2022-06-25 DIAGNOSIS — F410 Panic disorder [episodic paroxysmal anxiety] without agoraphobia: Secondary | ICD-10-CM | POA: Diagnosis not present

## 2022-07-03 ENCOUNTER — Emergency Department: Payer: Medicaid Other

## 2022-07-03 ENCOUNTER — Other Ambulatory Visit: Payer: Self-pay

## 2022-07-03 ENCOUNTER — Emergency Department
Admission: EM | Admit: 2022-07-03 | Discharge: 2022-07-03 | Disposition: A | Discharge status: Home / Self Care | Payer: Medicaid Other | Attending: Emergency Medicine | Admitting: Emergency Medicine

## 2022-07-03 DIAGNOSIS — M79641 Pain in right hand: Secondary | ICD-10-CM | POA: Diagnosis not present

## 2022-07-03 DIAGNOSIS — S60512A Abrasion of left hand, initial encounter: Secondary | ICD-10-CM | POA: Diagnosis not present

## 2022-07-03 DIAGNOSIS — S60511A Abrasion of right hand, initial encounter: Secondary | ICD-10-CM | POA: Insufficient documentation

## 2022-07-03 DIAGNOSIS — Y9389 Activity, other specified: Secondary | ICD-10-CM | POA: Insufficient documentation

## 2022-07-03 DIAGNOSIS — S6991XA Unspecified injury of right wrist, hand and finger(s), initial encounter: Secondary | ICD-10-CM | POA: Diagnosis present

## 2022-07-03 DIAGNOSIS — W01198A Fall on same level from slipping, tripping and stumbling with subsequent striking against other object, initial encounter: Secondary | ICD-10-CM | POA: Insufficient documentation

## 2022-07-03 DIAGNOSIS — Y92512 Supermarket, store or market as the place of occurrence of the external cause: Secondary | ICD-10-CM | POA: Diagnosis not present

## 2022-07-03 DIAGNOSIS — S0990XA Unspecified injury of head, initial encounter: Secondary | ICD-10-CM | POA: Insufficient documentation

## 2022-07-03 DIAGNOSIS — M542 Cervicalgia: Secondary | ICD-10-CM | POA: Diagnosis not present

## 2022-07-03 DIAGNOSIS — W19XXXA Unspecified fall, initial encounter: Secondary | ICD-10-CM

## 2022-07-03 DIAGNOSIS — Z043 Encounter for examination and observation following other accident: Secondary | ICD-10-CM | POA: Diagnosis not present

## 2022-07-03 MED ORDER — MELOXICAM 15 MG PO TABS: 15.0000 mg | ORAL_TABLET | Freq: Every day | ORAL | 1 refills | Status: AC

## 2022-07-03 MED ORDER — OXYCODONE-ACETAMINOPHEN 5-325 MG PO TABS
1.0000 | ORAL_TABLET | Freq: Once | ORAL | Status: AC
  Administered 2022-07-03: 1 via ORAL
  Filled 2022-07-03: qty 1

## 2022-07-03 MED ORDER — ONDANSETRON 4 MG PO TBDP
4.0000 mg | ORAL_TABLET | Freq: Once | ORAL | Status: DC
  Filled 2022-07-03: qty 1

## 2022-07-03 NOTE — Discharge Instructions (Addendum)
Take Meloxicam once daily for pain and inflammation.  

## 2022-07-03 NOTE — ED Triage Notes (Signed)
Pt to ED for fall while pushing shopping cart. States hit head. Denies loc. Denies blood thinner use.

## 2022-07-03 NOTE — ED Provider Notes (Signed)
[facility] Provider Note  Patient Contact: 5:38 PM (approximate)   History   No chief complaint on file.   HPI  Casey Barry is a 42 y.o. female presents to the emergency department after patient had a mechanical fall outside a store.  Patient was pushing a cart which she states was unstable which caused her to fall.  Patient sustained several superficial abrasions along the bilateral hands and is complaining of right middle finger pain.  She states that she also hit her head.  She has had no numbness or tingling in the upper and lower extremities but does endorse some neck pain.  No chest pain, chest tightness or abdominal pain.      Physical Exam   Triage Vital Signs: ED Triage Vitals  Enc Vitals Group     BP 07/03/22 1641 119/80     Pulse Rate 07/03/22 1641 93     Resp 07/03/22 1641 16     Temp 07/03/22 1641 98.5 F (36.9 C)     Temp src --      SpO2 07/03/22 1641 98 %     Weight 07/03/22 1642 153 lb (69.4 kg)     Height 07/03/22 1642 5\' 1"  (1.549 m)     Head Circumference --      Peak Flow --      Pain Score 07/03/22 1641 10     Pain Loc --      Pain Edu? --      Excl. in GC? --     Most recent vital signs: Vitals:   07/03/22 1641  BP: 119/80  Pulse: 93  Resp: 16  Temp: 98.5 F (36.9 C)  SpO2: 98%     General: Alert and in no acute distress. Eyes:  PERRL. EOMI. Head: No acute traumatic findings ENT:      Nose: No congestion/rhinnorhea.      Mouth/Throat: Mucous membranes are moist.  Neck: No stridor. No cervical spine tenderness to palpation. Cardiovascular:  Good peripheral perfusion Respiratory: Normal respiratory effort without tachypnea or retractions. Lungs CTAB. Good air entry to the bases with no decreased or absent breath sounds. Gastrointestinal: Bowel sounds 4 quadrants. Soft and nontender to palpation. No guarding or rigidity. No palpable masses. No distention. No CVA tenderness. Musculoskeletal: Full range of  motion to all extremities.  Neurologic:  No gross focal neurologic deficits are appreciated.  Skin: Patient has superficial abrasions of the bilateral hands.     ED Results / Procedures / Treatments   Labs (all labs ordered are listed, but only abnormal results are displayed) Labs Reviewed - No data to display     RADIOLOGY  I personally viewed and evaluated these images as part of my medical decision making, as well as reviewing the written report by the radiologist.  ED Provider Interpretation: No acute abnormality on CT head or cervical spine.  Right hand x-ray unremarkable.   PROCEDURES:  Critical Care performed: No  Procedures   MEDICATIONS ORDERED IN ED: Medications  ondansetron (ZOFRAN-ODT) disintegrating tablet 4 mg (4 mg Oral Not Given 07/03/22 1850)  oxyCODONE-acetaminophen (PERCOCET/ROXICET) 5-325 MG per tablet 1 tablet (1 tablet Oral Given 07/03/22 1850)     IMPRESSION / MDM / ASSESSMENT AND PLAN / ED COURSE  I reviewed the triage vital signs and the nursing notes.  Assessment and plan:  Fall 42 year old female presents to the emergency department after patient had a mechanical fall.  Vital signs were reassuring at triage.  On exam, patient was alert and nontoxic-appearing.  CTs of the head and cervical spine showed no evidence of intracranial bleed, skull fracture or C-spine fracture.  Hand x-ray unremarkable.  Recommended daily meloxicam for pain and inflammation.  Return precautions were given to return with new or worsening symptoms.     FINAL CLINICAL IMPRESSION(S) / ED DIAGNOSES   Final diagnoses:  Fall, initial encounter     Rx / DC Orders   ED Discharge Orders          Ordered    meloxicam (MOBIC) 15 MG tablet  Daily        07/03/22 1905             Note:  This document was prepared using Dragon voice recognition software and may include unintentional dictation errors.   Gasper Lloyd 07/03/22 2033    Jene Every, MD 07/04/22 [phone]

## 2022-07-18 ENCOUNTER — Ambulatory Visit: Payer: Medicaid Other | Admitting: Gastroenterology

## 2022-08-25 DIAGNOSIS — F410 Panic disorder [episodic paroxysmal anxiety] without agoraphobia: Secondary | ICD-10-CM | POA: Diagnosis not present

## 2022-08-25 DIAGNOSIS — F4310 Post-traumatic stress disorder, unspecified: Secondary | ICD-10-CM | POA: Diagnosis not present

## 2022-08-25 DIAGNOSIS — F909 Attention-deficit hyperactivity disorder, unspecified type: Secondary | ICD-10-CM | POA: Diagnosis not present

## 2022-09-08 IMAGING — CT CT ANGIO CHEST
2 of 7 series · 18 of 46 positions shown · IV contrast (APPLIED)
Comparison: 04/27/2017

CLINICAL DATA: Chest pain

EXAM:
CT ANGIOGRAPHY CHEST WITH CONTRAST
TECHNIQUE: Multidetector CT imaging of the chest was performed using the
standard protocol during bolus administration of intravenous
contrast. Multiplanar CT image reconstructions and MIPs were
obtained to evaluate the vascular anatomy.

[Series 6: thins · axial · 0.87mm/px · z∈[-104,+151]mm · 15 of 410 slices shown]
[im 23/410  lung]
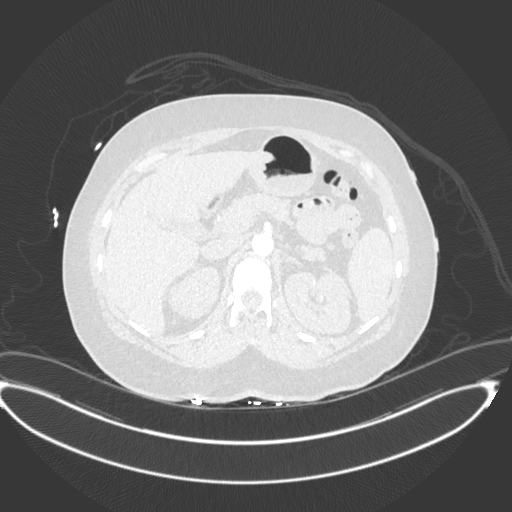
[im 46/410  soft-tissue]
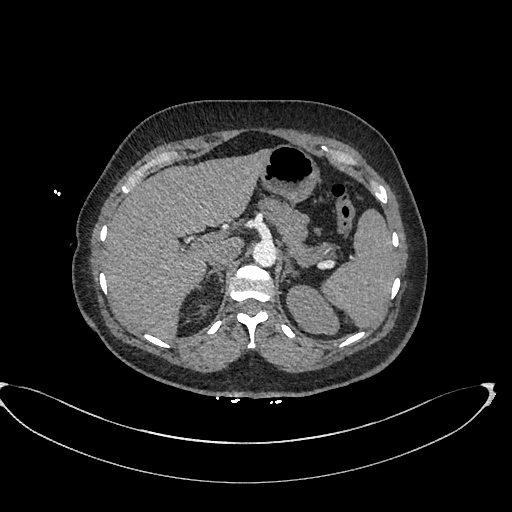
[im 69/410  lung]
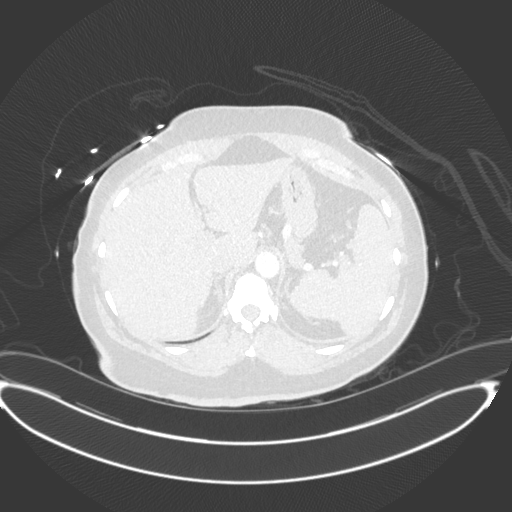
[im 91/410  soft-tissue]
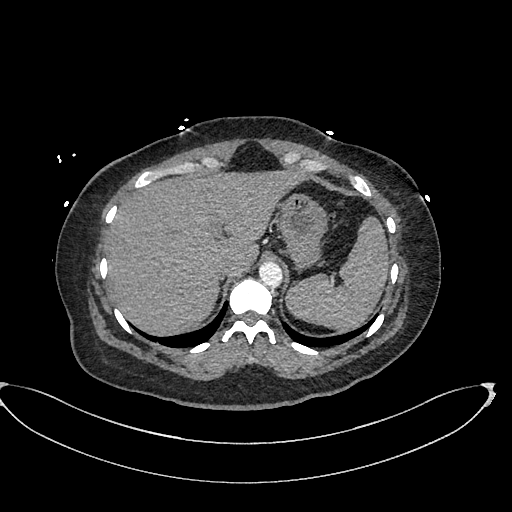
[im 137/410  lung]
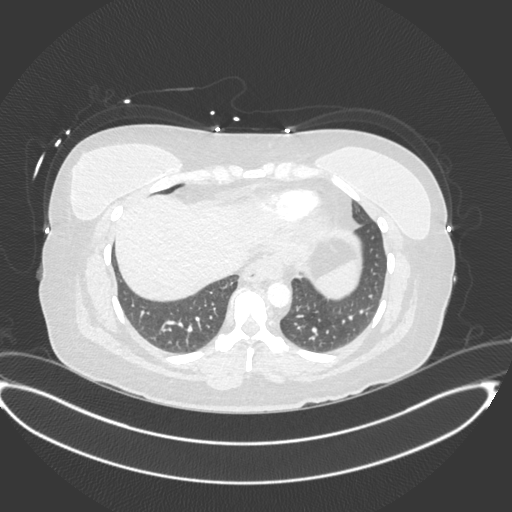
[im 160/410  soft-tissue]
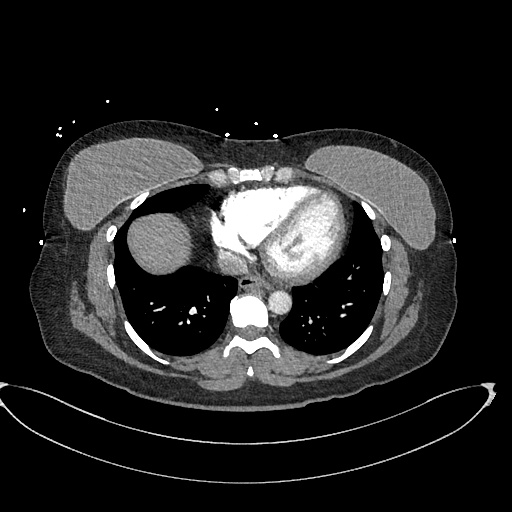
[im 182/410  lung]
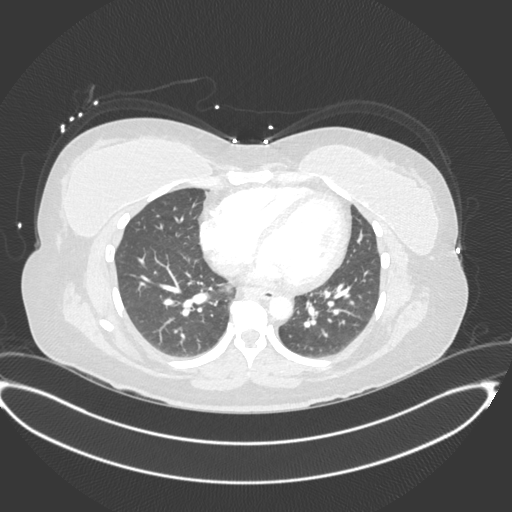
[im 205/410  soft-tissue]
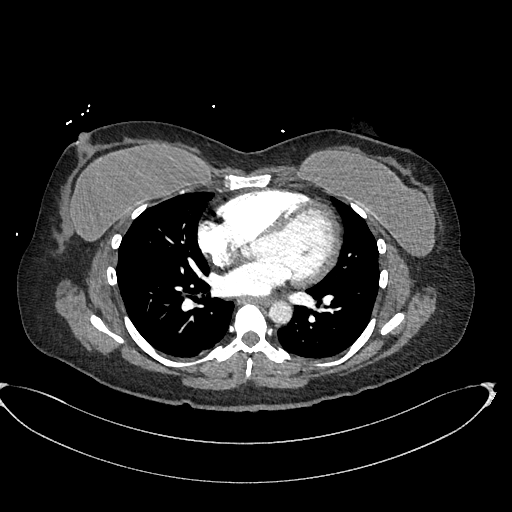
[im 228/410  lung]
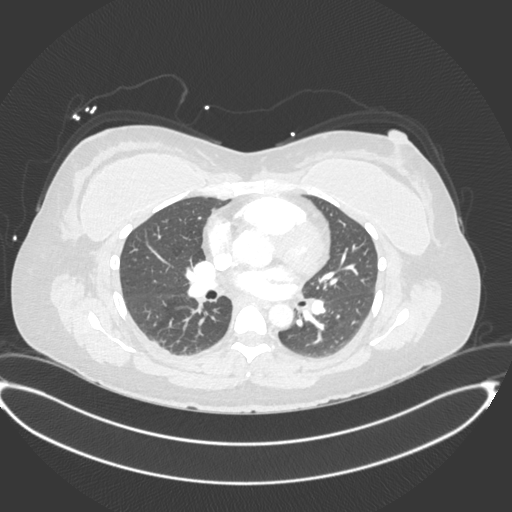
[im 250/410  soft-tissue]
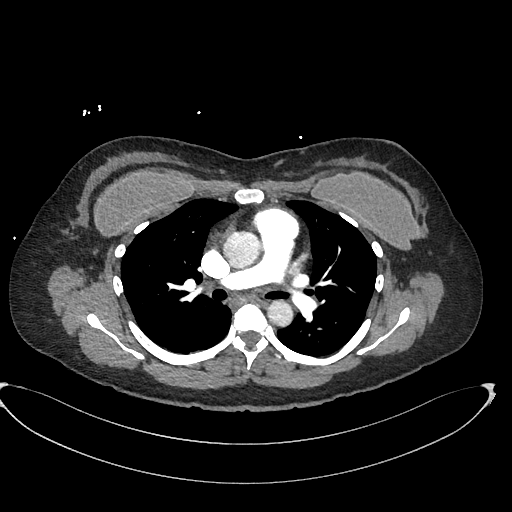
[im 273/410  lung]
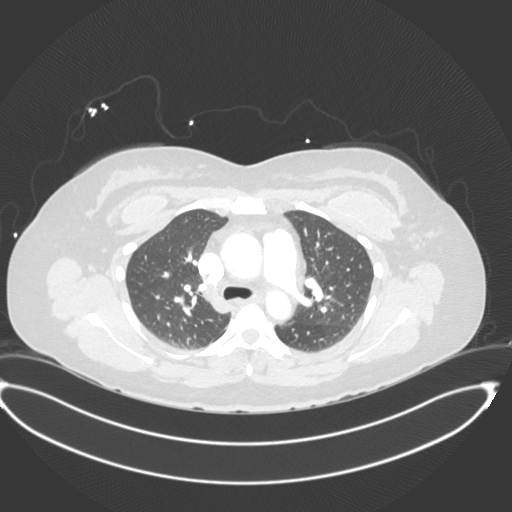
[im 319/410  soft-tissue]
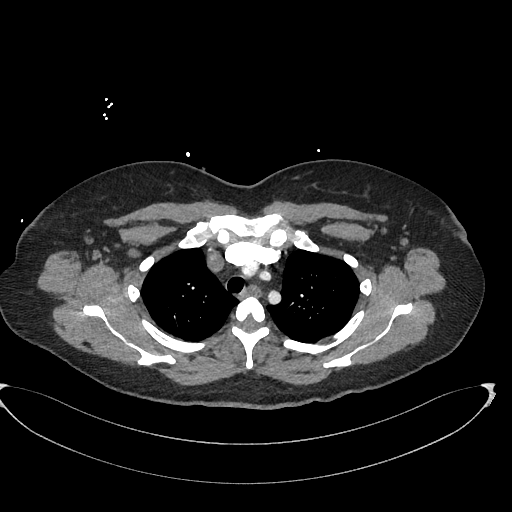
[im 341/410  lung]
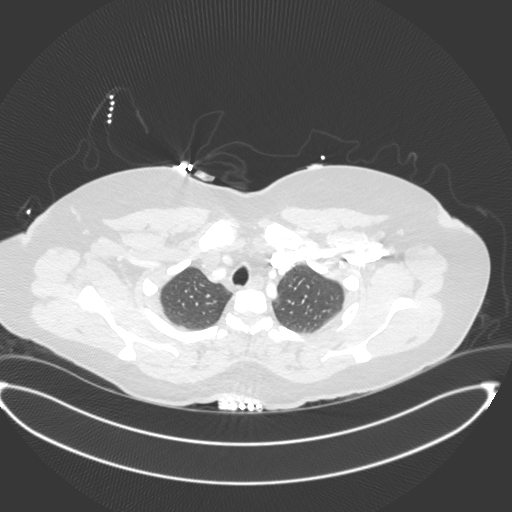
[im 364/410  soft-tissue]
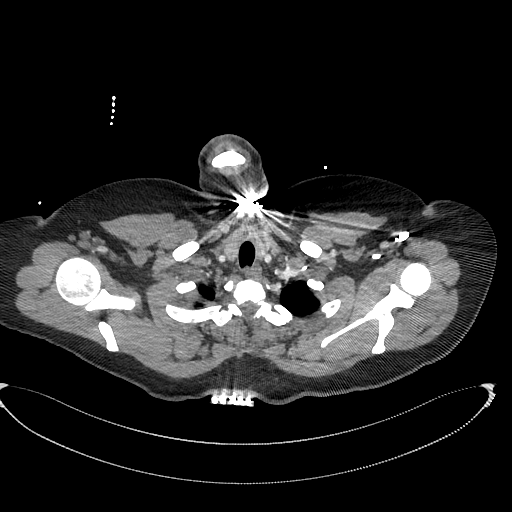
[im 387/410  lung]
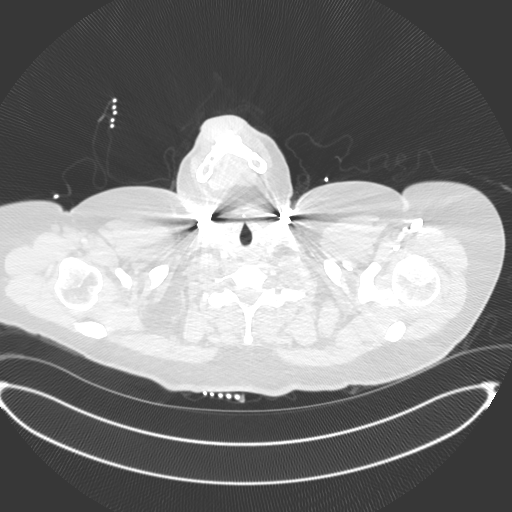

[Series 7: (person_name) · coronal · 0.60mm/px · 3 of 140 slices shown]
[im 35/140  soft-tissue]
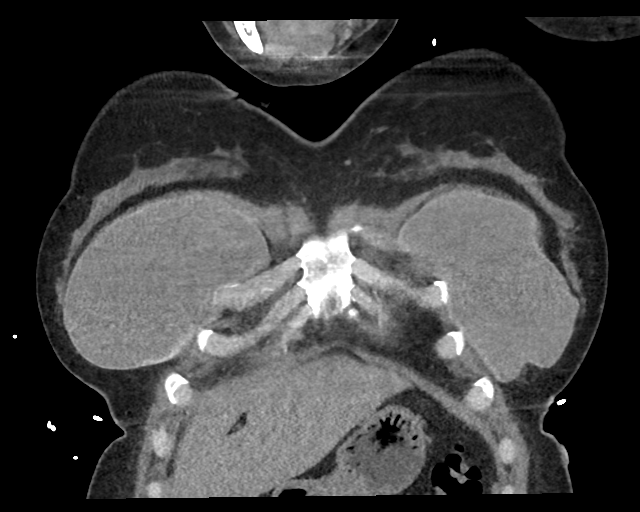
[im 70/140  soft-tissue]
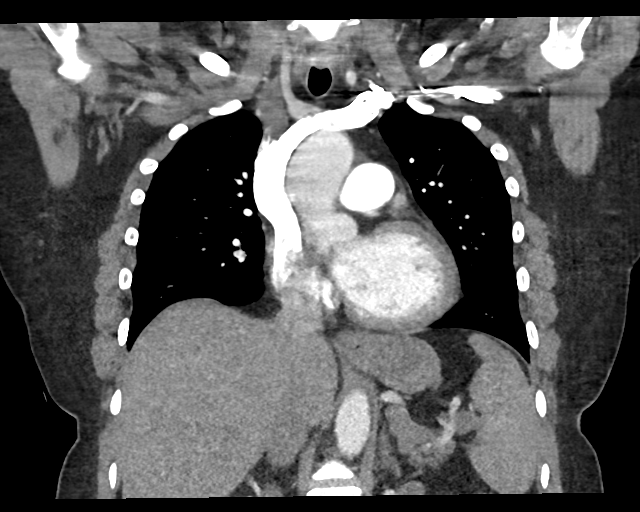
[im 105/140  soft-tissue]
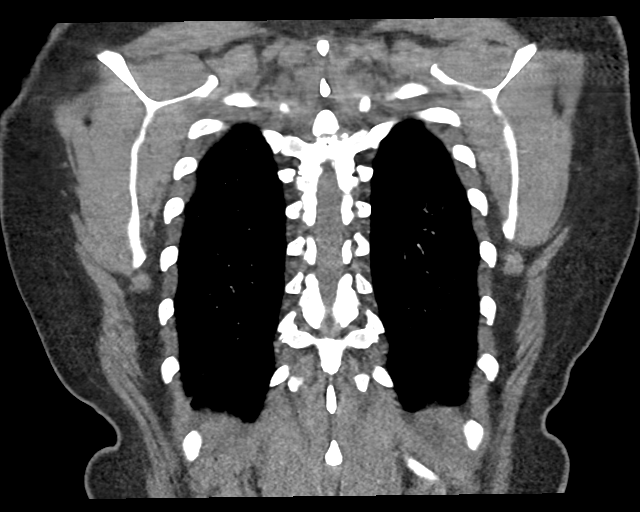

[18 of 46 positions shown; findings below may reference images not displayed]

RADIATION DOSE REDUCTION: This exam was performed according to the
departmental dose-optimization program which includes automated
exposure control, adjustment of the mA and/or kV according to
patient size and/or use of iterative reconstruction technique.

CONTRAST:  80mL OMNIPAQUE IOHEXOL 350 MG/ML SOLN
FINDINGS: Cardiovascular: There is homogeneous enhancement in thoracic aorta.
There is ectasia of ascending thoracic aorta measuring 3.6 cm. There
is mild ectasia of main pulmonary artery measuring 3.1 cm. There are
no intraluminal filling defects in the pulmonary artery branches.

Mediastinum/Nodes: No significant lymphadenopathy is seen. Beam
hardening artifacts caused by patient's necklace limits evaluation
of lower neck.

Lungs/Pleura: There is no focal pulmonary consolidation. There is no
pleural effusion or pneumothorax.

Upper Abdomen: Gallbladder stones are seen. Small hiatal hernia is
seen.

Musculoskeletal: Unremarkable. Augmentation prostheses are seen in
both breasts.

Review of the MIP images confirms the above findings.
IMPRESSION: There is no evidence of thoracic aortic dissection. There is no
evidence of pulmonary artery embolism. There is no focal pulmonary
consolidation.

Gallbladder stones. Small hiatal hernia. There is ectasia of
ascending thoracic aorta measuring 3.6 cm.

## 2022-09-08 IMAGING — CT CT ABD-PELV W/ CM
2 of 4 series · 16 of 46 positions shown, 18 images · IV contrast (agent unspecified)
Comparison: 05/30/2014

CLINICAL DATA: Abdominal pain, nausea, vomiting

EXAM:
CT ABDOMEN AND PELVIS WITH CONTRAST
TECHNIQUE: Multidetector CT imaging of the abdomen and pelvis was performed
using the standard protocol following bolus administration of
intravenous contrast.

[Series 5: abdomen 5.0 · axial · 0.92mm/px · z∈[-425,-15]mm · 13 of 96 slices shown, 15 images]
[im 7/96  soft-tissue]
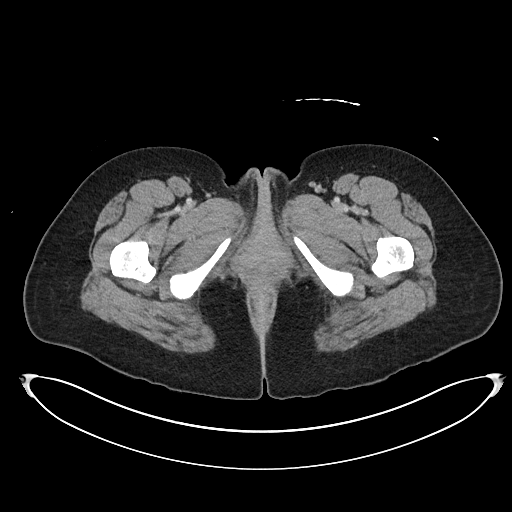
[im 7/96  bone]
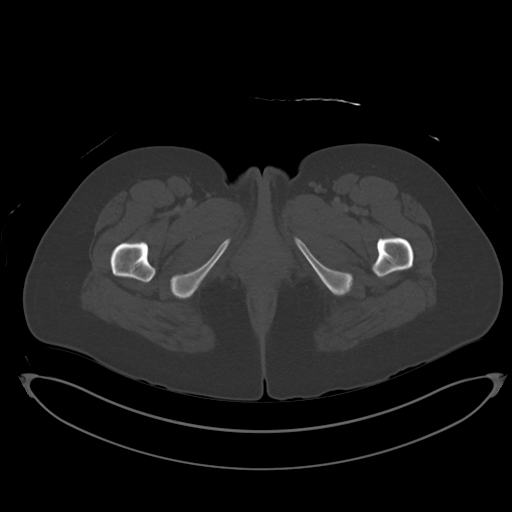
[im 13/96  soft-tissue]
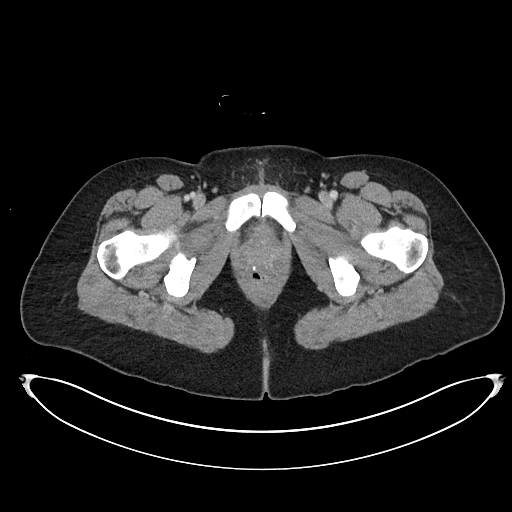
[im 20/96  soft-tissue]
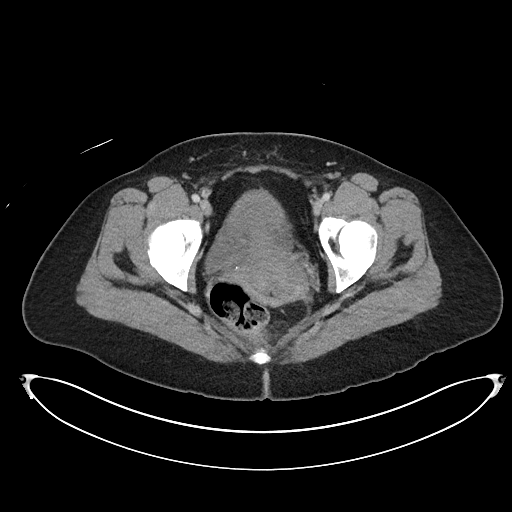
[im 26/96  soft-tissue]
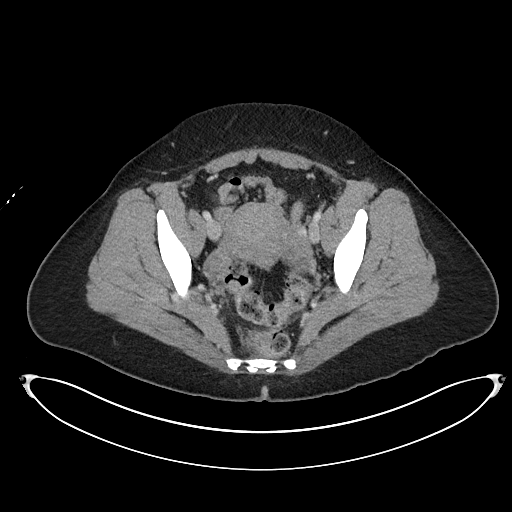
[im 32/96  soft-tissue]
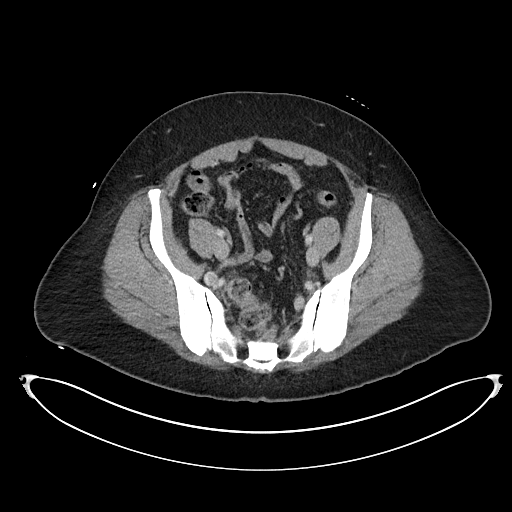
[im 39/96  soft-tissue]
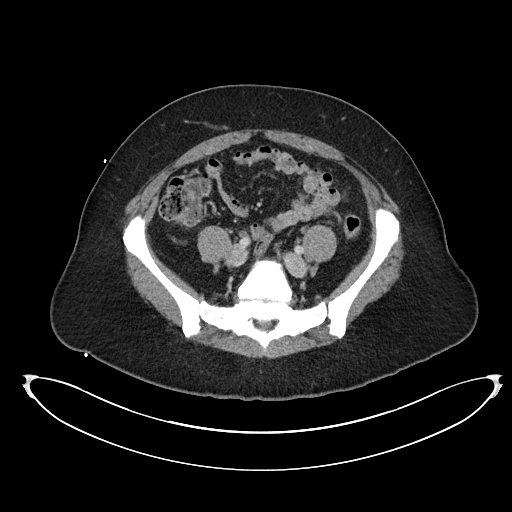
[im 51/96  soft-tissue]
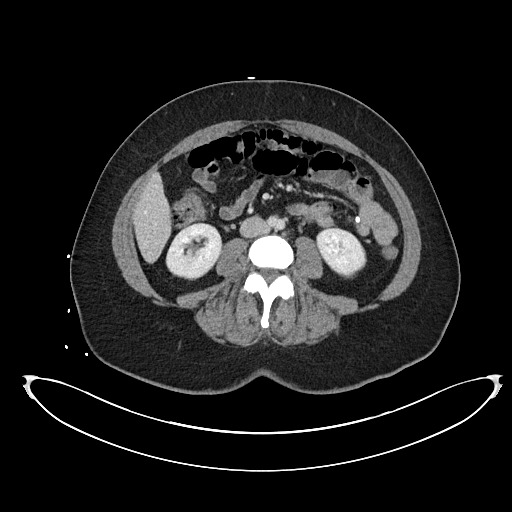
[im 58/96  soft-tissue]
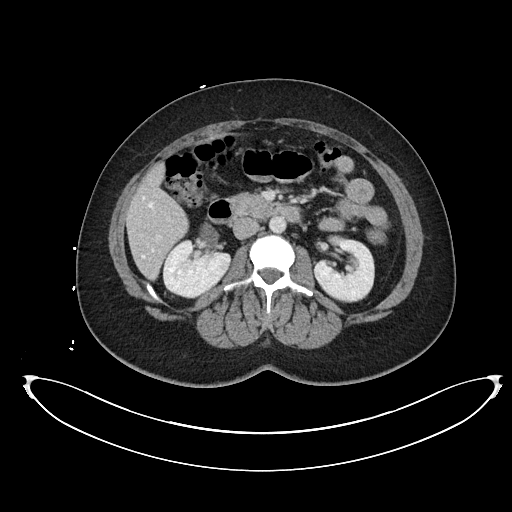
[im 64/96  soft-tissue]
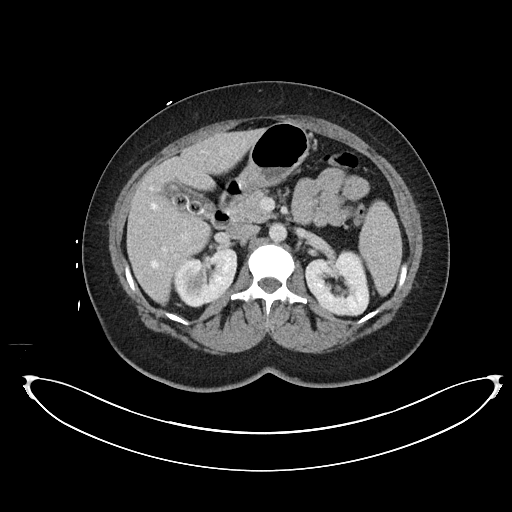
[im 64/96  bone]
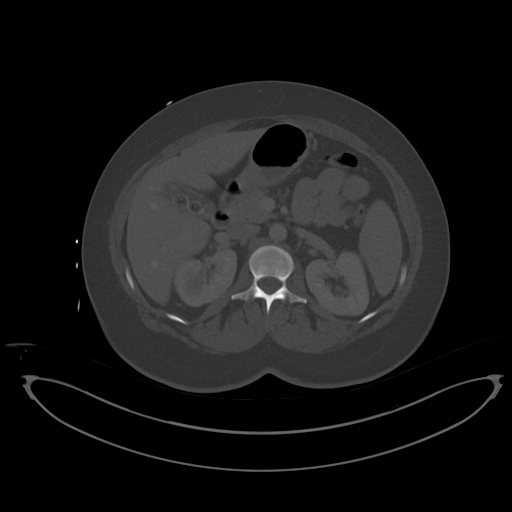
[im 70/96  soft-tissue]
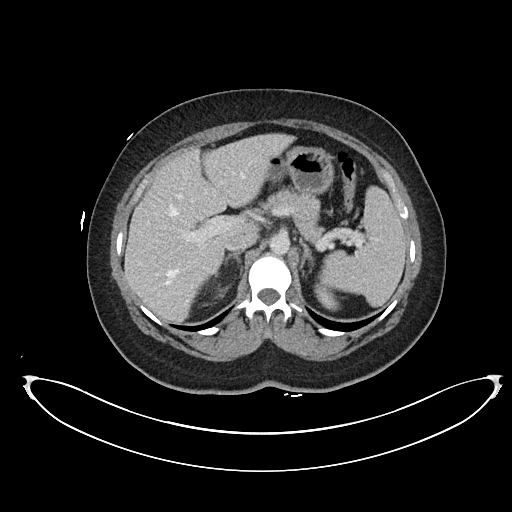
[im 77/96  soft-tissue]
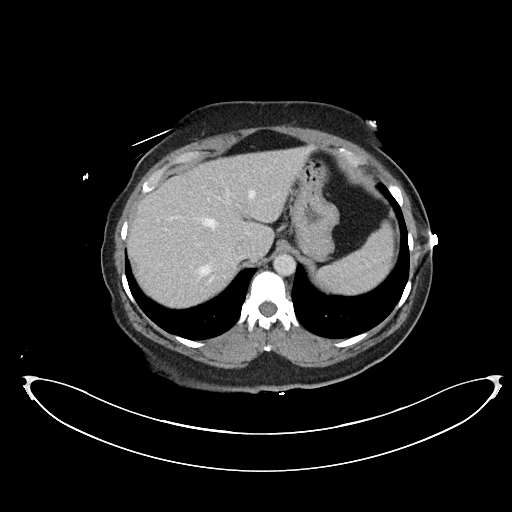
[im 83/96  soft-tissue]
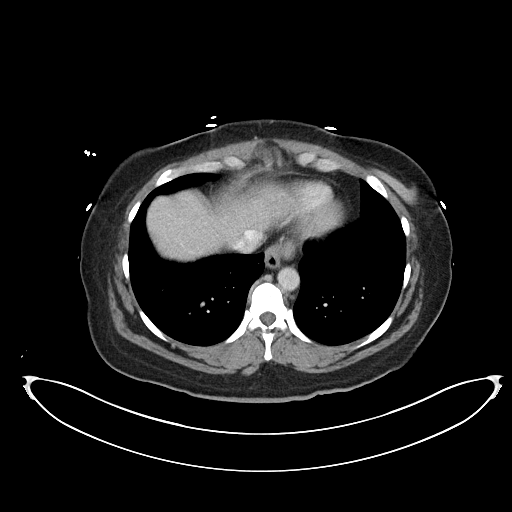
[im 89/96  soft-tissue]
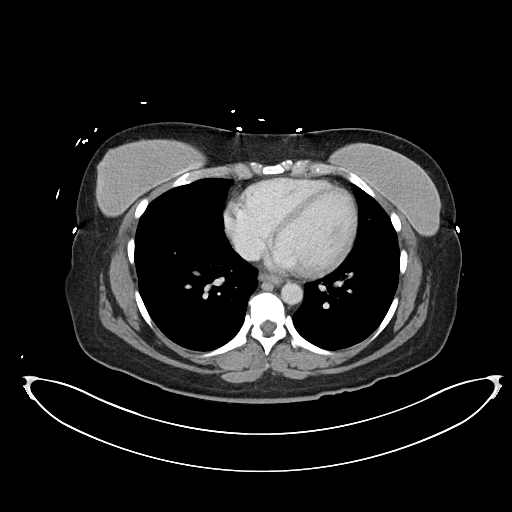

[Series 8: abdomen 3.0 mpr cor · coronal · 0.87mm/px · 3 of 105 slices shown]
[im 35/105  soft-tissue]
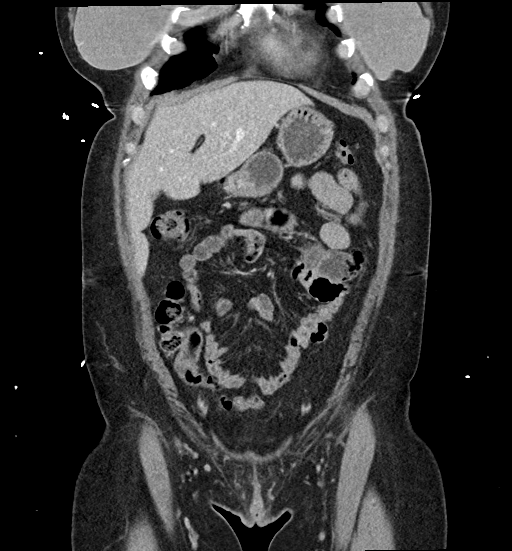
[im 47/105  soft-tissue]
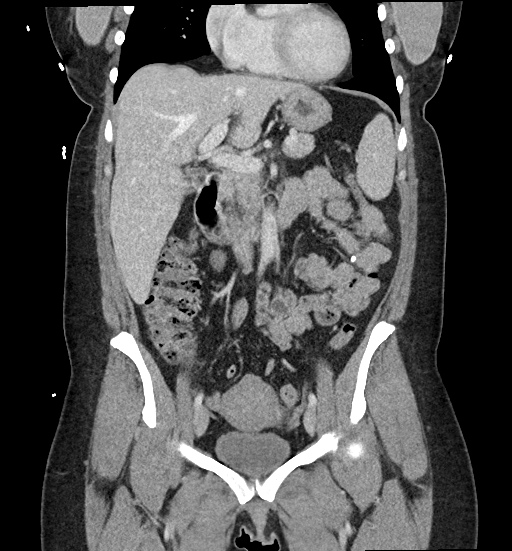
[im 58/105  soft-tissue]
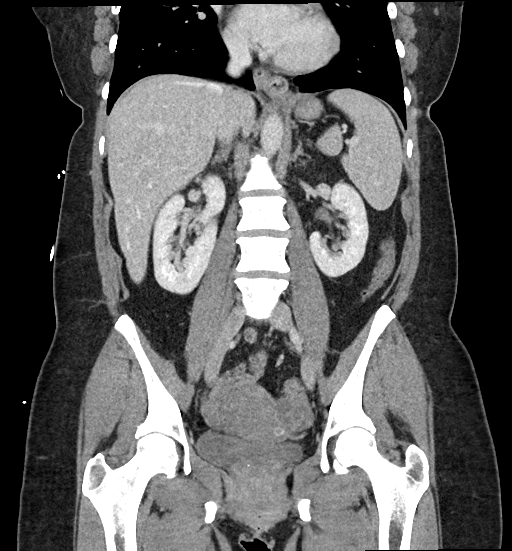

[16 of 46 positions shown; findings below may reference images not displayed]

RADIATION DOSE REDUCTION: This exam was performed according to the
departmental dose-optimization program which includes automated
exposure control, adjustment of the mA and/or kV according to
patient size and/or use of iterative reconstruction technique.

CONTRAST:  80mL OMNIPAQUE IOHEXOL 350 MG/ML SOLN
FINDINGS: Lower chest: Unremarkable.

Hepatobiliary: Liver measures 20.9 cm in length. There is no
dilation of bile ducts. There are multiple calcified gallbladder
stones.

Pancreas: No focal abnormality is seen.

Spleen: Unremarkable.

Adrenals/Urinary Tract: Adrenals are unremarkable. There is no
hydronephrosis. There is 1.3 cm low-density lesion with ill-defined
margins in the upper pole of right kidney. Density measurements are
higher than usual for simple cyst. There is another ill-defined 7 mm
low-density in the medial midportion of right kidney. There is 3 mm
low-density in the lower pole left kidney. There is mild malrotation
in the right kidney. There are no renal or ureteral stones. Urinary
bladder is not distended.

Stomach/Bowel: Small hiatal hernia is seen. Small bowel loops are
not dilated. Appendix is not seen. There is no pericecal
inflammation. There is no significant wall thickening in colon.
There is no pericolic stranding. Few diverticula are seen in the
colon.

Vascular/Lymphatic: Unremarkable.

Reproductive: There is 2.3 cm low-density structure in the left
adnexa, possibly follicle.

Other: There is no ascites or pneumoperitoneum. Small umbilical
hernia containing fat is seen.

Musculoskeletal: Unremarkable.
IMPRESSION: There is no evidence of intestinal obstruction or pneumoperitoneum.
There is no hydronephrosis.

There are few low-density lesions in the renal cortex in both
kidneys. Density measurements are higher than usual for simple
cysts. Follow-up renal sonogram may be considered.

Gallbladder stones.  Small hiatal hernia.

Other findings as described in the body of the report.

## 2022-10-14 DIAGNOSIS — K529 Noninfective gastroenteritis and colitis, unspecified: Secondary | ICD-10-CM | POA: Diagnosis not present

## 2022-10-22 ENCOUNTER — Encounter: Payer: Self-pay | Admitting: Gastroenterology

## 2022-10-22 ENCOUNTER — Ambulatory Visit (INDEPENDENT_AMBULATORY_CARE_PROVIDER_SITE_OTHER): Payer: Medicaid Other | Admitting: Gastroenterology

## 2022-10-22 VITALS — BP 110/80 | HR 89 | Ht 61.0 in | Wt 171.0 lb

## 2022-10-22 DIAGNOSIS — K921 Melena: Secondary | ICD-10-CM

## 2022-10-22 DIAGNOSIS — R195 Other fecal abnormalities: Secondary | ICD-10-CM

## 2022-10-22 DIAGNOSIS — A0472 Enterocolitis due to Clostridium difficile, not specified as recurrent: Secondary | ICD-10-CM | POA: Diagnosis not present

## 2022-10-22 MED ORDER — VANCOMYCIN HCL 125 MG PO CAPS: 125.0000 mg | ORAL_CAPSULE | Freq: Four times a day (QID) | ORAL | 0 refills | Status: DC

## 2022-10-22 MED ORDER — CHOLESTYRAMINE 4 G PO PACK: 4.0000 g | PACK | Freq: Two times a day (BID) | ORAL | 3 refills | Status: DC

## 2022-10-22 MED ORDER — NA SULFATE-K SULFATE-MG SULF 17.5-3.13-1.6 GM/177ML PO SOLN: 1.0000 | Freq: Once | ORAL | 0 refills | Status: AC

## 2022-10-22 NOTE — Progress Notes (Signed)
HPI : Casey Barry is a 42 y.o. female who is referred to [place] by the emergency department for further evaluation of diarrhea.  The patient states that she has had problems with diarrhea ever since she had her gallbladder out last June.  She reports having numerous watery bowel movements every day with urgency.  She will have a bowel movement immediately after eating, to the point that she stopped eating for a while because she was having so much diarrhea.  She reports a 40 pound weight loss since her gallbladder was removed last year.  She has crampy abdominal pain.  She has recently started seeing red blood in her stool.  No nausea or vomiting. She went to the emergency department in [place] with diarrhea.  CT scan of the abdomen and pelvis was unremarkable.  CBC CMP and lipase are unremarkable.  She was scheduled for a visit with me earlier in the year, but went to the wrong location and missed her appointment.  She states that she never did follow-up with the surgeon that did her cholecystectomy.  She states that her PCP Dr. Raynald Kemp did not perform any evaluation for her diarrhea or provide any medical therapy.  She went to another primary care (Dr. Mayford Knife) last week, who ordered stool studies, and told her this Monday that she had C. difficile.  He prescribed Flagyl, which she has been taking since Monday without improvement in her diarrhea.  She denies any recent antibiotics.  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  Otherwise no known C. difficile exposures.  None of her household members have had diarrhea.  Past Medical History:  Diagnosis Date   Cystitis    Gall stones    Kidney stones    Migraines    Pulmonary embolism (HCC)      Past Surgical History:  Procedure Laterality Date   AUGMENTATION MAMMAPLASTY     CHOLECYSTECTOMY N/A 07/04/2021   Procedure: LAPAROSCOPIC CHOLECYSTECTOMY WITH ICG DYE;  Surgeon: Gaynelle Adu, MD;  Location: [facility] OR;   Service: General;  Laterality: N/A;   TONSILLECTOMY     Family History  Problem Relation Age of Onset   COPD Mother    Hypertension Father    COPD Maternal Grandmother    Diabetes Maternal Grandmother    Lung cancer Maternal Grandmother    Parkinson's disease Maternal Grandfather    Hypertension Paternal Grandmother    Depression Paternal Grandmother    Anxiety disorder Paternal Grandmother    Breast cancer Other    Colon cancer Neg Hx    Esophageal cancer Neg Hx    Stomach cancer Neg Hx    Social History   Tobacco Use   Smoking status: Never   Smokeless tobacco: Never  Vaping Use   Vaping status: Some Days   Substances: Flavoring  Substance Use Topics   Alcohol use: No   Drug use: No   Current Outpatient Medications  Medication Sig Dispense Refill   alprazolam (XANAX) 2 MG tablet Take 2 mg by mouth at bedtime as needed for anxiety.     amitriptyline (ELAVIL) 10 MG tablet Take 1 tablet (10 mg total) by mouth at bedtime. (Patient not taking: Reported on 03/05/2022) 30 tablet 0   etonogestrel (NEXPLANON) 68 MG IMPL implant 1 each by Subdermal route once.     ibuprofen (ADVIL) 200 MG tablet Take 200 mg by mouth every 6 (six) hours as needed.     cephALEXin (  KEFLEX) 500 MG capsule Take 1 capsule (500 mg total) by mouth 2 (two) times daily. (Patient not taking: Reported on 10/22/2022) 14 capsule 0   doxepin (SINEQUAN) 25 MG capsule Take 25 mg by mouth at bedtime. (Patient not taking: Reported on 10/22/2022)     phenazopyridine (PYRIDIUM) 200 MG tablet Take 1 tablet (200 mg total) by mouth 3 (three) times daily. (Patient not taking: Reported on 10/22/2022) 6 tablet 0   prazosin (MINIPRESS) 2 MG capsule Take 2 mg by mouth at bedtime. (Patient not taking: Reported on 10/22/2022)     No current facility-administered medications for this visit.   Allergies  Allergen Reactions   Levaquin [Levofloxacin] Anaphylaxis     Review of Systems: All systems reviewed and negative except  where noted in HPI.    No results found.  Physical Exam: BP 110/80   Pulse 89   Ht 5\' 1"  (1.549 m)   Wt 171 lb (77.6 kg)   BMI 32.31 kg/m  Constitutional: Pleasant,well-developed, Caucasian female in no acute distress.  Accompanied by husband HEENT: Normocephalic and atraumatic. Conjunctivae are normal. No scleral icterus. Neck supple.  Cardiovascular: Normal rate, regular rhythm.  Pulmonary/chest: Effort normal and breath sounds normal. No wheezing, rales or rhonchi. Abdominal: Soft, nondistended, nontender. Bowel sounds active throughout. There are no masses palpable. No hepatomegaly. Extremities: no edema Neurological: Alert and oriented to person place and time. Skin: Skin is warm and dry. No rashes noted. Psychiatric: Normal mood and affect. Behavior is normal.  CBC    Component Value Date/Time   WBC 6.3 04/28/2022 1155   RBC 4.06 04/28/2022 1155   HGB 13.6 04/28/2022 1155   HGB 13.7 02/04/2022 1504   HCT 39.5 04/28/2022 1155   HCT 40.0 02/04/2022 1504   PLT 198 04/28/2022 1155   PLT 246 02/04/2022 1504   MCV 97.3 04/28/2022 1155   MCV 95 02/04/2022 1504   MCV 95 02/22/2011 0237   MCH 33.5 04/28/2022 1155   MCHC 34.4 04/28/2022 1155   RDW 12.2 04/28/2022 1155   RDW 12.7 02/04/2022 1504   RDW 13.9 02/22/2011 0237   LYMPHSABS 2.4 02/04/2022 1504   LYMPHSABS 2.4 02/22/2011 0237   MONOABS 0.5 12/24/2016 0904   MONOABS 0.5 02/22/2011 0237   EOSABS 0.1 02/04/2022 1504   EOSABS 0.1 02/22/2011 0237   BASOSABS 0.1 02/04/2022 1504   BASOSABS 0.1 02/22/2011 0237    CMP     Component Value Date/Time   NA 138 04/28/2022 1155   NA 139 02/04/2022 1504   NA 140 02/22/2011 0237   K 3.8 04/28/2022 1155   K 3.9 02/22/2011 0237   CL 106 04/28/2022 1155   CL 105 02/22/2011 0237   CO2 23 04/28/2022 1155   CO2 24 02/22/2011 0237   GLUCOSE 93 04/28/2022 1155   GLUCOSE 94 02/22/2011 0237   BUN 14 04/28/2022 1155   BUN 11 02/04/2022 1504   BUN 13 02/22/2011 0237    CREATININE 0.71 04/28/2022 1155   CREATININE 0.61 02/22/2011 0237   CALCIUM 9.3 04/28/2022 1155   CALCIUM 8.6 02/22/2011 0237   PROT 7.1 04/28/2022 1155   PROT 7.1 02/04/2022 1504   PROT 7.2 02/20/2011 1630   ALBUMIN 4.2 04/28/2022 1155   ALBUMIN 4.9 02/04/2022 1504   ALBUMIN 3.8 02/20/2011 1630   AST 14 (L) 04/28/2022 1155   AST 42 (H) 02/20/2011 1630   ALT 12 04/28/2022 1155   ALT 71 02/20/2011 1630   ALKPHOS 46 04/28/2022 1155   ALKPHOS  109 02/20/2011 1630   BILITOT 1.0 04/28/2022 1155   BILITOT 0.7 02/04/2022 1504   BILITOT 0.5 02/20/2011 1630   GFRNONAA >60 04/28/2022 1155   GFRNONAA >60 02/22/2011 0237   GFRAA >60 11/27/2018 2317   GFRAA >60 02/22/2011 0237       Latest Ref Rng & Units 04/28/2022   11:55 AM 02/04/2022    3:04 PM 07/04/2021    2:47 AM  CBC EXTENDED  WBC 4.0 - 10.5 K/uL 6.3  7.4  6.6   RBC 3.87 - 5.11 MIL/uL 4.06  4.21  3.75   Hemoglobin 12.0 - 15.0 g/dL 35.5  73.2  20.2   HCT 36.0 - 46.0 % 39.5  40.0  35.8   Platelets 150 - 400 K/uL 198  246  119   NEUT# 1.4 - 7.0 x10E3/uL  4.3    Lymph# 0.7 - 3.1 x10E3/uL  2.4        ASSESSMENT AND PLAN:  42 year old female with 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  She was apparently recently diagnosed with C. difficile based on stool studies and was prescribed Flagyl.  From my discussion, it does not seem like the patient's symptoms are significantly worse recently than they have been in the past year.  I am not certain that the patient has an active C. difficile infection based on the chronicity of her diarrhea, but given her symptoms, the positive test no prior history of C. difficile, I think it is reasonable to treat her as if this is a C. difficile infection.  As Flagyl is not often effective against C. difficile, and she has taken it for 2 days with no improvement, I recommended we switch to oral vancomycin. I am more suspicious her diarrhea is secondary to bile acids.  We will also plan to start  cholestyramine twice daily.  Patient advised to time her medications so that she takes her other medicines 1 hour before cholestyramine. Patient advised to send [place] a message if her diarrhea is not improving towards the end of her vancomycin course. The patient's report of blood in her stool warrants a colonoscopy.  Although I am more suspicious of a benign anorectal source such as hemorrhoids, we will plan for a colonoscopy in a few months, once her possible C. difficile infection is resolved.  The patient was in agreement with this.  C. difficile colitis - Vancomycin 125 mg p.o. every 6 hours for 14 days - Stop metronidazole  Diarrhea following cholecystectomy - Suspect bile acid diarrhea - Start cholestyramine 4 g twice daily  Hematochezia - Suspect hemorrhoidal - Plan for colonoscopy once possible C. difficile infection completely resolved  The details, risks (including bleeding, perforation, infection, missed lesions, medication reactions and possible hospitalization or surgery if complications occur), benefits, and alternatives to colonoscopy with possible biopsy and possible polypectomy were discussed with the patient and she consents to proceed.    I spent a total of 45 minutes reviewing the patient's medical record, interviewing and examining the patient, discussing her diagnosis and management of her condition going forward, and documenting in the medical record  Philomena Buttermore E. Tomasa Rand, MD [facility]     No ref. provider found

## 2022-10-22 NOTE — Patient Instructions (Addendum)
_______________________________________________________  If your blood pressure at your visit was 140/90 or greater, please contact your primary care physician to follow up on this.  You have been scheduled for a colonoscopy. Please follow written instructions given to you at your visit today.   Please pick up your prep supplies at the pharmacy within the next 1-3 days.  If you use inhalers (even only as needed), please bring them with you on the day of your procedure.  We have sent the following medications to your pharmacy for you to pick up at your convenience: Vancomycin 125 mg every 6 hours for 14 days. Cholestyramine 4 grams twice daily.    DO NOT TAKE 7 DAYS PRIOR TO TEST- Trulicity (dulaglutide) Ozempic, Wegovy (semaglutide) Mounjaro (tirzepatide) Bydureon Bcise (exanatide extended release)  DO NOT TAKE 1 DAY PRIOR TO YOUR TEST Rybelsus (semaglutide) Adlyxin (lixisenatide) Victoza (liraglutide) Byetta (exanatide) ___________________________________________________________________________ If you are age 49 or older, your body mass index should be between 23-30. Your Body mass index is 32.31 kg/m. If this is out of the aforementioned range listed, please consider follow up with your Primary Care Provider.  If you are age 51 or younger, your body mass index should be between 19-25. Your Body mass index is 32.31 kg/m. If this is out of the aformentioned range listed, please consider follow up with your Primary Care Provider.   ________________________________________________________  The [facility] providers would like to encourage you to use [facility] to communicate with providers for non-urgent requests or questions.  Due to long hold times on the telephone, sending your provider a message by [facility] may be a faster and more efficient way to get a response.  Please allow 48 business hours for a response.  Please remember that this is for non-urgent requests.   It was a pleasure  to see you today!  Thank you for trusting me with your gastrointestinal care!    Scott E.Tomasa Rand, MD

## 2022-11-25 DIAGNOSIS — F4310 Post-traumatic stress disorder, unspecified: Secondary | ICD-10-CM | POA: Diagnosis not present

## 2022-11-25 DIAGNOSIS — F410 Panic disorder [episodic paroxysmal anxiety] without agoraphobia: Secondary | ICD-10-CM | POA: Diagnosis not present

## 2022-11-25 DIAGNOSIS — F909 Attention-deficit hyperactivity disorder, unspecified type: Secondary | ICD-10-CM | POA: Diagnosis not present

## 2022-11-26 DIAGNOSIS — H6092 Unspecified otitis externa, left ear: Secondary | ICD-10-CM | POA: Diagnosis not present

## 2022-11-26 DIAGNOSIS — R300 Dysuria: Secondary | ICD-10-CM | POA: Diagnosis not present

## 2022-12-04 DIAGNOSIS — Z720 Tobacco use: Secondary | ICD-10-CM | POA: Diagnosis not present

## 2022-12-04 DIAGNOSIS — R0789 Other chest pain: Secondary | ICD-10-CM | POA: Diagnosis not present

## 2022-12-04 DIAGNOSIS — Z8619 Personal history of other infectious and parasitic diseases: Secondary | ICD-10-CM | POA: Diagnosis not present

## 2022-12-04 DIAGNOSIS — R1084 Generalized abdominal pain: Secondary | ICD-10-CM | POA: Diagnosis not present

## 2022-12-04 DIAGNOSIS — R197 Diarrhea, unspecified: Secondary | ICD-10-CM | POA: Diagnosis not present

## 2022-12-04 DIAGNOSIS — Z5329 Procedure and treatment not carried out because of patient's decision for other reasons: Secondary | ICD-10-CM | POA: Diagnosis not present

## 2022-12-04 DIAGNOSIS — H9202 Otalgia, left ear: Secondary | ICD-10-CM | POA: Diagnosis not present

## 2022-12-04 DIAGNOSIS — J988 Other specified respiratory disorders: Secondary | ICD-10-CM | POA: Diagnosis not present

## 2023-01-28 ENCOUNTER — Ambulatory Visit: Payer: Medicaid Other | Admitting: Gastroenterology

## 2023-01-28 ENCOUNTER — Encounter: Payer: Self-pay | Admitting: Gastroenterology

## 2023-01-28 VITALS — BP 91/61 | HR 65 | Temp 98.1°F | Resp 2 | Ht 61.0 in | Wt 171.0 lb

## 2023-01-28 DIAGNOSIS — K602 Anal fissure, unspecified: Secondary | ICD-10-CM

## 2023-01-28 DIAGNOSIS — A0472 Enterocolitis due to Clostridium difficile, not specified as recurrent: Secondary | ICD-10-CM | POA: Diagnosis not present

## 2023-01-28 DIAGNOSIS — D125 Benign neoplasm of sigmoid colon: Secondary | ICD-10-CM | POA: Diagnosis not present

## 2023-01-28 DIAGNOSIS — K921 Melena: Secondary | ICD-10-CM | POA: Diagnosis not present

## 2023-01-28 DIAGNOSIS — D122 Benign neoplasm of ascending colon: Secondary | ICD-10-CM | POA: Diagnosis not present

## 2023-01-28 DIAGNOSIS — K635 Polyp of colon: Secondary | ICD-10-CM | POA: Diagnosis not present

## 2023-01-28 DIAGNOSIS — K5730 Diverticulosis of large intestine without perforation or abscess without bleeding: Secondary | ICD-10-CM

## 2023-01-28 DIAGNOSIS — R197 Diarrhea, unspecified: Secondary | ICD-10-CM

## 2023-01-28 DIAGNOSIS — K640 First degree hemorrhoids: Secondary | ICD-10-CM

## 2023-01-28 MED ORDER — AMBULATORY NON FORMULARY MEDICATION: 0 refills | Status: AC

## 2023-01-28 MED ORDER — SODIUM CHLORIDE 0.9 % IV SOLN: 500.0000 mL | Freq: Once | INTRAVENOUS | Status: AC

## 2023-01-28 NOTE — Op Note (Signed)
 [facility] Patient Name: Aiya [facility] Procedure Date: 01/28/2023 10:18 AM MRN: [id number] Endoscopist: Glendia E. Stacia , MD, [id number] Age: 43 Referring MD:  Date of Birth: 05-May-1980 Gender: Female Account #: [id number] Procedure:                Colonoscopy Indications:              Chronic diarrhea, Hematochezia Medicines:                Monitored Anesthesia Care Procedure:                Pre-Anesthesia Assessment:                           - Prior to the procedure, a History and Physical                            was performed, and patient medications and                            allergies were reviewed. The patient's tolerance of                            previous anesthesia was also reviewed. The risks                            and benefits of the procedure and the sedation                            options and risks were discussed with the patient.                            All questions were answered, and informed consent                            was obtained. Prior Anticoagulants: The patient has                            taken no anticoagulant or antiplatelet agents. ASA                            Grade Assessment: II - A patient with mild systemic                            disease. After reviewing the risks and benefits,                            the patient was deemed in satisfactory condition to                            undergo the procedure.                           After obtaining informed consent, the colonoscope  was passed under direct vision. Throughout the                            procedure, the patient's blood pressure, pulse, and                            oxygen saturations were monitored continuously. The                            Olympus Scope [id number] was introduced through the                            anus and advanced to the the terminal ileum, with                            identification of  the appendiceal orifice and IC                            valve. The colonoscopy was performed without                            difficulty. The patient tolerated the procedure                            well. The quality of the bowel preparation was good                            except the ascending colon and cecum were fair. The                            terminal ileum, ileocecal valve, appendiceal                            orifice, and rectum were photographed. The bowel                            preparation used was SUPREP via split dose                            instruction. Scope In: 10:39:13 AM Scope Out: 11:04:59 AM Scope Withdrawal Time: 0 hours 21 minutes 19 seconds  Total Procedure Duration: 0 hours 25 minutes 46 seconds  Findings:                 An anal fissure was found on perianal exam.                           The digital rectal exam was normal. Pertinent                            negatives include normal sphincter tone and no                            palpable rectal lesions.  A 13 mm polyp was found in the ascending colon. The                            polyp was sessile. The polyp was removed with a                            cold snare. Resection and retrieval were complete.                            Estimated blood loss was minimal. Estimated blood                            loss was minimal.                           A 7 mm polyp was found in the sigmoid colon. The                            polyp was sessile. The polyp was removed with a                            cold snare. Resection and retrieval were complete.                            Estimated blood loss was minimal.                           A few small-mouthed diverticula were found in the                            sigmoid colon.                           Normal mucosa was found in the entire colon.                            Biopsies for histology were taken with a  cold                            forceps from the ascending colon, transverse colon,                            descending colon and sigmoid colon for evaluation                            of microscopic colitis. Estimated blood loss was                            minimal.                           The terminal ileum appeared normal.  Non-bleeding internal hemorrhoids were found during                            retroflexion. The hemorrhoids were Grade I                            (internal hemorrhoids that do not prolapse).                           No additional abnormalities were found on                            retroflexion. Complications:            No immediate complications. Estimated Blood Loss:     Estimated blood loss was minimal. Impression:               - Anal fissure found on perianal exam.                           - One 13 mm polyp in the ascending colon, removed                            with a cold snare. Resected and retrieved.                           - One 7 mm polyp in the sigmoid colon, removed with                            a cold snare. Resected and retrieved.                           - Mild diverticulosis in the sigmoid colon.                           - Normal mucosa in the entire examined colon.                            Biopsied.                           - The examined portion of the ileum was normal.                           - Non-bleeding internal hemorrhoids.                           - Hematochezia secondary to either fissure or                            internal hemorrhoids. Recommendation:           - Patient has a contact number available for                            emergencies. The signs and symptoms of potential  delayed complications were discussed with the                            patient. Return to normal activities tomorrow.                            Written discharge instructions  were provided to the                            patient.                           - Resume previous diet.                           - Continue present medications.                           - Await pathology results.                           - Repeat colonoscopy (date not yet determined) for                            surveillance based on pathology results.                           - If having pain with bowel movements, recommend                            treating anal fissure with intra-anal nitroglycerin                            0.125% twice daily for 4-6 weeks                           - Recommend daily fiber supplement such as                            Metamucil. Jerzy Crotteau E. Stacia, MD 01/28/2023 11:15:02 AM This report has been signed electronically.

## 2023-01-28 NOTE — Progress Notes (Signed)
 Vss nad trans to pacu

## 2023-01-28 NOTE — Patient Instructions (Signed)
 Recommendation:           - Patient has a contact number available for                            emergencies. The signs and symptoms of potential                            delayed complications were discussed with the                            patient. Return to normal activities tomorrow.                            Written discharge instructions were provided to the                            patient.                           - Resume previous diet.                           - Continue present medications.                           - Await pathology results.                           - Repeat colonoscopy (date not yet determined) for                            surveillance based on pathology results.                           - If having pain with bowel movements, recommend                            treating anal fissure with intra-anal nitroglycerin                            0.125% twice daily for 4-6 weeks                           - Recommend daily fiber supplement such as                            Metamucil.  Please see handouts provided by discharge nurse: Polyps, Hemorrhoids, and Diverticulosis  YOU HAD AN ENDOSCOPIC PROCEDURE TODAY AT THE [facility]:   Refer to the procedure report that was given to you for any specific questions about what was found during the examination.  If the procedure report does not answer your questions, please call your gastroenterologist to clarify.  If you requested that your care partner not be given the details of your procedure findings, then the procedure report has been included in a sealed envelope for you to review at your convenience later.  YOU SHOULD  EXPECT: Some feelings of bloating in the abdomen. Passage of more gas than usual.  Walking can help get rid of the air that was put into your GI tract during the procedure and reduce the bloating. If you had a lower endoscopy (such as a colonoscopy or flexible sigmoidoscopy) you may  notice spotting of blood in your stool or on the toilet paper. If you underwent a bowel prep for your procedure, you may not have a normal bowel movement for a few days.  Please Note:  You might notice some irritation and congestion in your nose or some drainage.  This is from the oxygen used during your procedure.  There is no need for concern and it should clear up in a day or so.  SYMPTOMS TO REPORT IMMEDIATELY:  Following lower endoscopy (colonoscopy or flexible sigmoidoscopy):  Excessive amounts of blood in the stool  Significant tenderness or worsening of abdominal pains  Swelling of the abdomen that is new, acute  Fever of 100F or higher  For urgent or emergent issues, a gastroenterologist can be reached at any hour by calling [phone]. Do not use MyChart messaging for urgent concerns.    DIET:  We do recommend a small meal at first, but then you may proceed to your regular diet.  Drink plenty of fluids but you should avoid alcoholic beverages for 24 hours.  ACTIVITY:  You should plan to take it easy for the rest of today and you should NOT DRIVE or use heavy machinery until tomorrow (because of the sedation medicines used during the test).    FOLLOW UP: Our staff will call the number listed on your records the next business day following your procedure.  We will call around 7:15- 8:00 am to check on you and address any questions or concerns that you may have regarding the information given to you following your procedure. If we do not reach you, we will leave a message.     If any biopsies were taken you will be contacted by phone or by letter within the next 1-3 weeks.  Please call us  at [phone] if you have not heard about the biopsies in 3 weeks.    SIGNATURES/CONFIDENTIALITY: You and/or your care partner have signed paperwork which will be entered into your electronic medical record.  These signatures attest to the fact that that the information above on your After  Visit Summary has been reviewed and is understood.  Full responsibility of the confidentiality of this discharge information lies with you and/or your care-partner.

## 2023-01-28 NOTE — Progress Notes (Signed)
 [facility] History and Physical   Primary Care Physician:  System, Provider Not In   Reason for Procedure:   Diarrhea, hematochezia  Plan:    Colonoscopy     HPI: Casey Barry is a 43 y.o. female undergoing colonoscopy to evaluate hematochezia  She has also had problems with chronic diarrhea and was treated for C Diff in October.  She continues to have poorly formed stools, but not watery diarrhea.  She has multiple family members with colon polyps, including her father who sounds like he had advanced polyps in his 35s, but no family history of colon cancer.   Past Medical History:  Diagnosis Date   Cystitis    Gall stones    Kidney stones    Migraines    Pulmonary embolism (HCC)     Past Surgical History:  Procedure Laterality Date   AUGMENTATION MAMMAPLASTY     CHOLECYSTECTOMY N/A 07/04/2021   Procedure: LAPAROSCOPIC CHOLECYSTECTOMY WITH ICG DYE;  Surgeon: Tanda Locus, MD;  Location: [facility] OR;  Service: General;  Laterality: N/A;   TONSILLECTOMY      Prior to Admission medications   Medication Sig Start Date End Date Taking? Authorizing Provider  ALPRAZOLAM  XR 3 MG 24 hr tablet Take 3 mg by mouth daily as needed.   Yes [provider]  amitriptyline  (ELAVIL ) 10 MG tablet Take 1 tablet (10 mg total) by mouth at bedtime. Patient not taking: Reported on 01/28/2023 02/05/22   Golda Lynwood PARAS, MD  etonogestrel (NEXPLANON) 68 MG IMPL implant 1 each by Subdermal route once.   Yes [provider]  cholestyramine  (QUESTRAN ) 4 g packet Take 1 packet (4 g total) by mouth 2 (two) times daily. Patient not taking: Reported on 01/28/2023 10/22/22   Stacia Glendia BRAVO, MD  doxepin (SINEQUAN) 25 MG capsule Take 25 mg by mouth at bedtime. Patient not taking: Reported on 01/28/2023 03/25/22   [provider]  ibuprofen  (ADVIL ) 200 MG tablet Take 200 mg by mouth every 6 (six) hours as needed.    [provider]  oxyCODONE -acetaminophen  (PERCOCET)  10-325 MG tablet Take by mouth. 10/29/21   [provider]  phenazopyridine  (PYRIDIUM ) 200 MG tablet Take 1 tablet (200 mg total) by mouth 3 (three) times daily. Patient not taking: Reported on 01/28/2023 04/28/22   Lenor Hollering, MD  phentermine (ADIPEX-P) 37.5 MG tablet Take 37.5 mg by mouth every morning. 01/22/23   [provider]  prazosin (MINIPRESS) 2 MG capsule Take 2 mg by mouth at bedtime. Patient not taking: Reported on 01/28/2023 05/22/22   [provider]  Zinc Oxide 13 % CREA Apply as needed up to 4 (four) times daily Patient not taking: Reported on 01/28/2023 12/04/22   [provider]    Current Outpatient Medications  Medication Sig Dispense Refill   ALPRAZOLAM  XR 3 MG 24 hr tablet Take 3 mg by mouth daily as needed.     amitriptyline  (ELAVIL ) 10 MG tablet Take 1 tablet (10 mg total) by mouth at bedtime. (Patient not taking: Reported on 01/28/2023) 30 tablet 0   etonogestrel (NEXPLANON) 68 MG IMPL implant 1 each by Subdermal route once.     cholestyramine  (QUESTRAN ) 4 g packet Take 1 packet (4 g total) by mouth 2 (two) times daily. (Patient not taking: Reported on 01/28/2023) 120 each 3   doxepin (SINEQUAN) 25 MG capsule Take 25 mg by mouth at bedtime. (Patient not taking: Reported on 01/28/2023)     ibuprofen  (ADVIL ) 200 MG tablet  Take 200 mg by mouth every 6 (six) hours as needed.     oxyCODONE -acetaminophen  (PERCOCET) 10-325 MG tablet Take by mouth.     phenazopyridine  (PYRIDIUM ) 200 MG tablet Take 1 tablet (200 mg total) by mouth 3 (three) times daily. (Patient not taking: Reported on 01/28/2023) 6 tablet 0   phentermine (ADIPEX-P) 37.5 MG tablet Take 37.5 mg by mouth every morning.     prazosin (MINIPRESS) 2 MG capsule Take 2 mg by mouth at bedtime. (Patient not taking: Reported on 01/28/2023)     Zinc Oxide 13 % CREA Apply as needed up to 4 (four) times daily (Patient not taking: Reported on 01/28/2023)     Current Facility-Administered Medications   Medication Dose Route Frequency Provider Last Rate Last Admin   0.9 %  sodium chloride  infusion  500 mL Intravenous Once Stacia Glendia BRAVO, MD        Allergies as of 01/28/2023 - Review Complete 01/28/2023  Allergen Reaction Noted   Levaquin [levofloxacin] Anaphylaxis 01/21/2014    Family History  Problem Relation Age of Onset   COPD Mother    Hypertension Father    COPD Maternal Grandmother    Diabetes Maternal Grandmother    Lung cancer Maternal Grandmother    Parkinson's disease Maternal Grandfather    Hypertension Paternal Grandmother    Depression Paternal Grandmother    Anxiety disorder Paternal Grandmother    Breast cancer Other    Colon cancer Neg Hx    Esophageal cancer Neg Hx    Stomach cancer Neg Hx     Social History   Socioeconomic History   Marital status: Married    Spouse name: Not on file   Number of children: 6   Years of education: Not on file   Highest education level: Not on file  Occupational History   Occupation: health care/ self employed  Tobacco Use   Smoking status: Never   Smokeless tobacco: Never  Vaping Use   Vaping status: Some Days   Substances: Flavoring  Substance and Sexual Activity   Alcohol use: No   Drug use: No   Sexual activity: Not Currently  Other Topics Concern   Not on file  Social History Narrative   Not on file   Social Drivers of Health   Financial Resource Strain: Not on file  Food Insecurity: No Food Insecurity (03/21/2021)   Received from [facility], [facility]   Hunger Vital Sign    Worried About Running Out of Food in the Last Year: Never true    Ran Out of Food in the Last Year: Never true  Transportation Needs: Not on file  Physical Activity: Not on file  Stress: Not on file  Social Connections: Unknown (05/29/2021)   Received from [facility], [facility]   Social Network    Social Network: Not on file  Intimate Partner Violence: Unknown (04/25/2021)   Received from [facility], [facility]   HITS    Physically Hurt: Not on file    Insult or Talk Down To: Not on file    Threaten Physical Harm: Not on file    Scream or Curse: Not on file    Review of Systems:  All other review of systems negative except as mentioned in the HPI.  Physical Exam: Vital signs BP (!) 125/93   Pulse 93   Temp 98.1 F (36.7 C) (Temporal)   Ht 5' 1 (1.549 m)   Wt 171 lb (77.6 kg)   SpO2  100%   BMI 32.31 kg/m   General:   Alert,  Well-developed, well-nourished, pleasant and cooperative in NAD Airway:  Mallampati 2 Lungs:  Clear throughout to auscultation.   Heart:  Regular rate and rhythm; no murmurs, clicks, rubs,  or gallops. Abdomen:  Soft, nontender and nondistended. Normal bowel sounds.   Neuro/Psych:  Normal mood and affect. A and O x 3   Jaszmine Navejas E. Stacia, MD [facility]

## 2023-01-28 NOTE — Progress Notes (Signed)
 Called to room to assist during endoscopic procedure.  Patient ID and intended procedure confirmed with present staff. Received instructions for my participation in the procedure from the performing physician.

## 2023-01-29 ENCOUNTER — Telehealth: Payer: Self-pay

## 2023-01-29 NOTE — Telephone Encounter (Signed)
Attempted to reach patient for post-procedure f/u call. No answer. Left message for her to please not hesitate to call if she has any questions/concerns regarding her care. 

## 2023-02-02 LAB — SURGICAL PATHOLOGY

## 2023-02-04 NOTE — Progress Notes (Signed)
 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  Studies shows that at least 20% of women over age 44 and 30% of men over age 69 have pre-cancerous polyps.  Based on current nationally recognized surveillance guidelines, I recommend that you have a repeat colonoscopy in 3 years.   The biopsies taken from your colon were normal and there was no evidence of Microscopic Colitis or chronic inflammatory changes to explain diarrhea.  I recommend taking a daily fiber supplement as discussed to improve your stool bulk and consistency.    Please follow up with me as needed in the office for further management of your chronic GI symptoms.

## 2023-02-21 ENCOUNTER — Emergency Department (HOSPITAL_COMMUNITY)
Admission: EM | Admit: 2023-02-21 | Discharge: 2023-02-22 | Disposition: A | Discharge status: Home / Self Care | Payer: No Typology Code available for payment source | Attending: Emergency Medicine | Admitting: Emergency Medicine

## 2023-02-21 ENCOUNTER — Emergency Department (HOSPITAL_COMMUNITY): Payer: No Typology Code available for payment source

## 2023-02-21 ENCOUNTER — Other Ambulatory Visit: Payer: Self-pay

## 2023-02-21 DIAGNOSIS — Y92410 Unspecified street and highway as the place of occurrence of the external cause: Secondary | ICD-10-CM | POA: Diagnosis not present

## 2023-02-21 DIAGNOSIS — M25552 Pain in left hip: Secondary | ICD-10-CM | POA: Insufficient documentation

## 2023-02-21 DIAGNOSIS — Z041 Encounter for examination and observation following transport accident: Secondary | ICD-10-CM | POA: Diagnosis not present

## 2023-02-21 DIAGNOSIS — H5711 Ocular pain, right eye: Secondary | ICD-10-CM | POA: Insufficient documentation

## 2023-02-21 DIAGNOSIS — S199XXA Unspecified injury of neck, initial encounter: Secondary | ICD-10-CM | POA: Diagnosis not present

## 2023-02-21 DIAGNOSIS — S0993XA Unspecified injury of face, initial encounter: Secondary | ICD-10-CM | POA: Diagnosis not present

## 2023-02-21 DIAGNOSIS — R0789 Other chest pain: Secondary | ICD-10-CM | POA: Diagnosis not present

## 2023-02-21 DIAGNOSIS — R079 Chest pain, unspecified: Secondary | ICD-10-CM | POA: Diagnosis not present

## 2023-02-21 DIAGNOSIS — R102 Pelvic and perineal pain: Secondary | ICD-10-CM | POA: Diagnosis not present

## 2023-02-21 DIAGNOSIS — M7918 Myalgia, other site: Secondary | ICD-10-CM

## 2023-02-21 DIAGNOSIS — M542 Cervicalgia: Secondary | ICD-10-CM | POA: Insufficient documentation

## 2023-02-21 DIAGNOSIS — G44319 Acute post-traumatic headache, not intractable: Secondary | ICD-10-CM | POA: Diagnosis not present

## 2023-02-21 DIAGNOSIS — S3993XA Unspecified injury of pelvis, initial encounter: Secondary | ICD-10-CM | POA: Diagnosis not present

## 2023-02-21 DIAGNOSIS — R519 Headache, unspecified: Secondary | ICD-10-CM | POA: Diagnosis not present

## 2023-02-21 NOTE — ED Notes (Signed)
C-spine clear and c-collar able to be removed per Posey Rea, MD
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
## 2023-02-22 ENCOUNTER — Encounter (HOSPITAL_COMMUNITY): Payer: Self-pay

## 2023-02-22 MED ORDER — METHOCARBAMOL 500 MG PO TABS
500.0000 mg | ORAL_TABLET | Freq: Once | ORAL | Status: AC
  Administered 2023-02-22: 500 mg via ORAL
  Filled 2023-02-22: qty 1

## 2023-02-22 MED ORDER — KETOROLAC TROMETHAMINE 15 MG/ML IJ SOLN
15.0000 mg | Freq: Once | INTRAMUSCULAR | Status: AC
  Administered 2023-02-22: 15 mg via INTRAMUSCULAR
  Filled 2023-02-22: qty 1

## 2023-02-22 MED ORDER — METHOCARBAMOL 500 MG PO TABS: 500.0000 mg | ORAL_TABLET | Freq: Two times a day (BID) | ORAL | 0 refills | Status: AC | PRN

## 2023-02-22 MED ORDER — NAPROXEN 500 MG PO TABS: 500.0000 mg | ORAL_TABLET | Freq: Two times a day (BID) | ORAL | 0 refills | Status: AC
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
## 2023-02-24 DIAGNOSIS — F410 Panic disorder [episodic paroxysmal anxiety] without agoraphobia: Secondary | ICD-10-CM | POA: Diagnosis not present

## 2023-02-26 ENCOUNTER — Telehealth: Payer: Self-pay | Admitting: Gastroenterology

## 2023-02-26 NOTE — Telephone Encounter (Signed)
 PT is calling to discuss pathology report. Please advise.

## 2023-02-26 NOTE — Telephone Encounter (Signed)
 Reviewed results and recommendations per Dr. Cherryl Corona regarding pathology. Pt verbalized understanding.

## 2023-09-10 DIAGNOSIS — E559 Vitamin D deficiency, unspecified: Secondary | ICD-10-CM | POA: Diagnosis not present

## 2023-09-10 DIAGNOSIS — D513 Other dietary vitamin B12 deficiency anemia: Secondary | ICD-10-CM | POA: Diagnosis not present

## 2023-09-10 DIAGNOSIS — R5382 Chronic fatigue, unspecified: Secondary | ICD-10-CM | POA: Diagnosis not present

## 2023-09-10 DIAGNOSIS — I10 Essential (primary) hypertension: Secondary | ICD-10-CM | POA: Diagnosis not present

## 2023-09-10 DIAGNOSIS — K591 Functional diarrhea: Secondary | ICD-10-CM | POA: Diagnosis not present

## 2023-09-10 DIAGNOSIS — D649 Anemia, unspecified: Secondary | ICD-10-CM | POA: Diagnosis not present

## 2023-09-23 DIAGNOSIS — E785 Hyperlipidemia, unspecified: Secondary | ICD-10-CM | POA: Diagnosis not present

## 2023-09-23 DIAGNOSIS — Z713 Dietary counseling and surveillance: Secondary | ICD-10-CM | POA: Diagnosis not present

## 2023-09-23 DIAGNOSIS — Z6835 Body mass index (BMI) 35.0-35.9, adult: Secondary | ICD-10-CM | POA: Diagnosis not present

## 2023-09-23 DIAGNOSIS — R5382 Chronic fatigue, unspecified: Secondary | ICD-10-CM | POA: Diagnosis not present

## 2023-11-08 ENCOUNTER — Other Ambulatory Visit
Admission: RE | Admit: 2023-11-08 | Discharge: 2023-11-08 | Disposition: A | Discharge status: Home / Self Care | Source: Ambulatory Visit | Attending: Family Medicine | Admitting: Family Medicine

## 2023-11-08 DIAGNOSIS — N912 Amenorrhea, unspecified: Secondary | ICD-10-CM | POA: Diagnosis not present

## 2023-11-08 DIAGNOSIS — R8290 Unspecified abnormal findings in urine: Secondary | ICD-10-CM | POA: Diagnosis not present

## 2023-11-08 DIAGNOSIS — N390 Urinary tract infection, site not specified: Secondary | ICD-10-CM | POA: Diagnosis not present

## 2023-11-08 DIAGNOSIS — R6889 Other general symptoms and signs: Secondary | ICD-10-CM | POA: Diagnosis not present

## 2023-11-08 DIAGNOSIS — Z86711 Personal history of pulmonary embolism: Secondary | ICD-10-CM | POA: Diagnosis not present

## 2023-11-08 DIAGNOSIS — Z03818 Encounter for observation for suspected exposure to other biological agents ruled out: Secondary | ICD-10-CM | POA: Diagnosis not present

## 2023-11-08 LAB — D-DIMER, QUANTITATIVE: D-Dimer, Quant: <0.27 ug{FEU}/mL (ref 0.00–0.50)
